# Patient Record
Sex: Male | Born: 1955 | Race: White | Hispanic: No | Marital: Married | Smoking: Former smoker
Health system: Southern US, Community
[De-identification: ages and names within clinical notes are randomized; demographics above are authoritative.]

## PROBLEM LIST (undated history)

## (undated) DIAGNOSIS — M1990 Unspecified osteoarthritis, unspecified site: Secondary | ICD-10-CM

## (undated) DIAGNOSIS — I10 Essential (primary) hypertension: Secondary | ICD-10-CM

## (undated) DIAGNOSIS — A048 Other specified bacterial intestinal infections: Secondary | ICD-10-CM

## (undated) DIAGNOSIS — M5020 Other cervical disc displacement, unspecified cervical region: Secondary | ICD-10-CM

## (undated) DIAGNOSIS — C4490 Unspecified malignant neoplasm of skin, unspecified: Secondary | ICD-10-CM

## (undated) DIAGNOSIS — N052 Unspecified nephritic syndrome with diffuse membranous glomerulonephritis: Principal | ICD-10-CM

## (undated) DIAGNOSIS — N059 Unspecified nephritic syndrome with unspecified morphologic changes: Secondary | ICD-10-CM

## (undated) HISTORY — PX: BACK SURGERY: SHX140

## (undated) HISTORY — DX: Unspecified nephritic syndrome with diffuse membranous glomerulonephritis: N05.2

## (undated) HISTORY — PX: OTHER SURGICAL HISTORY: SHX169

## (undated) HISTORY — DX: Unspecified malignant neoplasm of skin, unspecified: C44.90

## (undated) HISTORY — DX: Essential (primary) hypertension: I10

## (undated) HISTORY — DX: Unspecified nephritic syndrome with unspecified morphologic changes: N05.9

## (undated) HISTORY — PX: KNEE SURGERY: SHX244

## (undated) HISTORY — DX: Other cervical disc displacement, unspecified cervical region: M50.20

## (undated) HISTORY — DX: Unspecified osteoarthritis, unspecified site: M19.90

## (undated) HISTORY — DX: Other specified bacterial intestinal infections: A04.8

## (undated) HISTORY — PX: NECK SURGERY: SHX720

---

## 2003-04-01 ENCOUNTER — Encounter: Payer: Self-pay | Admitting: Neurosurgery

## 2003-04-03 ENCOUNTER — Ambulatory Visit (HOSPITAL_COMMUNITY): Admission: RE | Admit: 2003-04-03 | Discharge: 2003-04-03 | Payer: Self-pay | Admitting: Neurosurgery

## 2003-04-03 ENCOUNTER — Encounter: Payer: Self-pay | Admitting: Neurosurgery

## 2003-09-24 ENCOUNTER — Encounter: Payer: Self-pay | Admitting: Neurosurgery

## 2003-09-28 ENCOUNTER — Ambulatory Visit (HOSPITAL_COMMUNITY): Admission: RE | Admit: 2003-09-28 | Discharge: 2003-09-29 | Payer: Self-pay | Admitting: Neurosurgery

## 2003-09-28 ENCOUNTER — Encounter: Payer: Self-pay | Admitting: Neurosurgery

## 2003-12-02 ENCOUNTER — Encounter: Admission: RE | Admit: 2003-12-02 | Discharge: 2003-12-02 | Payer: Self-pay | Admitting: Neurosurgery

## 2004-11-14 ENCOUNTER — Ambulatory Visit: Payer: Self-pay

## 2005-06-02 ENCOUNTER — Other Ambulatory Visit: Payer: Self-pay

## 2005-06-06 ENCOUNTER — Ambulatory Visit: Payer: Self-pay | Admitting: Orthopaedic Surgery

## 2013-02-02 ENCOUNTER — Emergency Department: Payer: Self-pay | Admitting: Emergency Medicine

## 2013-02-02 ENCOUNTER — Ambulatory Visit: Payer: Self-pay | Admitting: Family Medicine

## 2015-01-19 ENCOUNTER — Observation Stay: Payer: Self-pay | Admitting: Nephrology

## 2015-01-19 DIAGNOSIS — N052 Unspecified nephritic syndrome with diffuse membranous glomerulonephritis: Secondary | ICD-10-CM

## 2015-01-19 HISTORY — DX: Unspecified nephritic syndrome with diffuse membranous glomerulonephritis: N05.2

## 2015-01-19 LAB — COMPREHENSIVE METABOLIC PANEL
ALK PHOS: 52 U/L (ref 46–116)
ALT: 20 U/L (ref 14–63)
Albumin: 2.4 g/dL — ABNORMAL LOW (ref 3.4–5.0)
Anion Gap: 7 (ref 7–16)
BILIRUBIN TOTAL: 0.4 mg/dL (ref 0.2–1.0)
BUN: 22 mg/dL — AB (ref 7–18)
CO2: 30 mmol/L (ref 21–32)
Calcium, Total: 8.7 mg/dL (ref 8.5–10.1)
Chloride: 105 mmol/L (ref 98–107)
Creatinine: 0.87 mg/dL (ref 0.60–1.30)
EGFR (African American): >60
Glucose: 106 mg/dL — ABNORMAL HIGH (ref 65–99)
Osmolality: 287 (ref 275–301)
Potassium: 3.8 mmol/L (ref 3.5–5.1)
SGOT(AST): 18 U/L (ref 15–37)
Sodium: 142 mmol/L (ref 136–145)
Total Protein: 5.6 g/dL — ABNORMAL LOW (ref 6.4–8.2)

## 2015-01-19 LAB — CBC WITH DIFFERENTIAL/PLATELET
Basophil #: 0 10*3/uL (ref 0.0–0.1)
Basophil %: 0.5 %
Eosinophil #: 0.1 10*3/uL (ref 0.0–0.7)
Eosinophil %: 0.8 %
HCT: 43.5 % (ref 40.0–52.0)
HGB: 14.8 g/dL (ref 13.0–18.0)
Lymphocyte #: 1.6 10*3/uL (ref 1.0–3.6)
Lymphocyte %: 18.7 %
MCH: 31 pg (ref 26.0–34.0)
MCHC: 34 g/dL (ref 32.0–36.0)
MCV: 91 fL (ref 80–100)
Monocyte #: 0.5 x10 3/mm (ref 0.2–1.0)
Monocyte %: 5.5 %
Neutrophil #: 6.3 10*3/uL (ref 1.4–6.5)
Neutrophil %: 74.5 %
Platelet: 168 10*3/uL (ref 150–440)
RBC: 4.77 10*6/uL (ref 4.40–5.90)
RDW: 13.4 % (ref 11.5–14.5)
WBC: 8.4 10*3/uL (ref 3.8–10.6)

## 2015-01-19 LAB — PROTEIN / CREATININE RATIO, URINE
Creatinine, Urine: 37.6 mg/dL (ref 30.0–125.0)
Protein, Random Urine: 292 mg/dL — ABNORMAL HIGH (ref 0–12)
Protein/Creat. Ratio: 7766 mg/gCREAT — ABNORMAL HIGH (ref 0–200)

## 2015-01-19 LAB — URINALYSIS, COMPLETE
Bacteria: NONE SEEN
Bilirubin,UR: NEGATIVE
Glucose,UR: NEGATIVE mg/dL (ref 0–75)
Hyaline Cast: 1
Ketone: NEGATIVE
Leukocyte Esterase: NEGATIVE
Nitrite: NEGATIVE
Ph: 6 (ref 4.5–8.0)
Protein: >=500
RBC,UR: 5 /HPF (ref 0–5)
Specific Gravity: 1.008 (ref 1.003–1.030)
Squamous Epithelial: NONE SEEN
WBC UR: 1 /HPF (ref 0–5)

## 2015-01-19 LAB — HEMOGLOBIN: HGB: 15.2 g/dL (ref 13.0–18.0)

## 2015-01-19 LAB — PROTIME-INR
INR: 0.9
Prothrombin Time: 12.1 secs

## 2015-01-20 LAB — BASIC METABOLIC PANEL
ANION GAP: 5 — AB (ref 7–16)
BUN: 27 mg/dL — AB (ref 7–18)
CALCIUM: 8.6 mg/dL (ref 8.5–10.1)
Chloride: 104 mmol/L (ref 98–107)
Co2: 32 mmol/L (ref 21–32)
Creatinine: 1.07 mg/dL (ref 0.60–1.30)
Glucose: 115 mg/dL — ABNORMAL HIGH (ref 65–99)
Osmolality: 287 (ref 275–301)
POTASSIUM: 3.9 mmol/L (ref 3.5–5.1)
Sodium: 141 mmol/L (ref 136–145)

## 2015-01-20 LAB — URINALYSIS, COMPLETE
BILIRUBIN, UR: NEGATIVE
Glucose,UR: NEGATIVE mg/dL (ref 0–75)
KETONE: NEGATIVE
Leukocyte Esterase: NEGATIVE
NITRITE: NEGATIVE
Ph: 6 (ref 4.5–8.0)
Protein: >=500
RBC,UR: 11 /HPF (ref 0–5)
Specific Gravity: 1.018 (ref 1.003–1.030)
Squamous Epithelial: NONE SEEN

## 2015-01-20 LAB — CBC WITH DIFFERENTIAL/PLATELET
Basophil #: 0.1 10*3/uL (ref 0.0–0.1)
Basophil %: 0.6 %
EOS ABS: 0.1 10*3/uL (ref 0.0–0.7)
Eosinophil %: 1.6 %
HCT: 43.4 % (ref 40.0–52.0)
HGB: 14.5 g/dL (ref 13.0–18.0)
LYMPHS PCT: 23 %
Lymphocyte #: 2 10*3/uL (ref 1.0–3.6)
MCH: 31 pg (ref 26.0–34.0)
MCHC: 33.5 g/dL (ref 32.0–36.0)
MCV: 92 fL (ref 80–100)
Monocyte #: 0.5 x10 3/mm (ref 0.2–1.0)
Monocyte %: 6 %
NEUTROS PCT: 68.8 %
Neutrophil #: 6.1 10*3/uL (ref 1.4–6.5)
Platelet: 185 10*3/uL (ref 150–440)
RBC: 4.7 10*6/uL (ref 4.40–5.90)
RDW: 13.7 % (ref 11.5–14.5)
WBC: 8.9 10*3/uL (ref 3.8–10.6)

## 2015-02-18 ENCOUNTER — Ambulatory Visit
Admit: 2015-02-18 | Disposition: A | Discharge status: Home / Self Care | Payer: Self-pay | Attending: Hematology and Oncology | Admitting: Hematology and Oncology

## 2015-03-19 ENCOUNTER — Ambulatory Visit
Admit: 2015-03-19 | Disposition: A | Discharge status: Home / Self Care | Payer: Self-pay | Attending: Hematology and Oncology | Admitting: Hematology and Oncology

## 2015-03-19 ENCOUNTER — Ambulatory Visit
Admit: 2015-03-19 | Disposition: A | Discharge status: Home / Self Care | Payer: Self-pay | Attending: Unknown Physician Specialty | Admitting: Unknown Physician Specialty

## 2015-03-19 DIAGNOSIS — A048 Other specified bacterial intestinal infections: Secondary | ICD-10-CM

## 2015-03-19 HISTORY — DX: Other specified bacterial intestinal infections: A04.8

## 2015-03-22 ENCOUNTER — Ambulatory Visit
Admit: 2015-03-22 | Disposition: A | Discharge status: Home / Self Care | Payer: Self-pay | Attending: Hematology and Oncology | Admitting: Hematology and Oncology

## 2015-03-25 ENCOUNTER — Ambulatory Visit
Admit: 2015-03-25 | Disposition: A | Discharge status: Home / Self Care | Payer: Self-pay | Attending: Hematology and Oncology | Admitting: Hematology and Oncology

## 2015-04-01 LAB — CBC CANCER CENTER
Basophil #: 0 x10 3/mm (ref 0.0–0.1)
Basophil %: 0.4 %
Eosinophil #: 0 x10 3/mm (ref 0.0–0.7)
Eosinophil %: 0.3 %
HCT: 32.2 % — ABNORMAL LOW (ref 40.0–52.0)
HGB: 11 g/dL — ABNORMAL LOW (ref 13.0–18.0)
Lymphocyte #: 1.3 x10 3/mm (ref 1.0–3.6)
Lymphocyte %: 12.8 %
MCH: 30.2 pg (ref 26.0–34.0)
MCHC: 34.2 g/dL (ref 32.0–36.0)
MCV: 88 fL (ref 80–100)
Monocyte #: 0.4 x10 3/mm (ref 0.2–1.0)
Monocyte %: 3.8 %
Neutrophil #: 8.6 x10 3/mm — ABNORMAL HIGH (ref 1.4–6.5)
Neutrophil %: 82.7 %
Platelet: 184 x10 3/mm (ref 150–440)
RBC: 3.64 10*6/uL — ABNORMAL LOW (ref 4.40–5.90)
RDW: 14 % (ref 11.5–14.5)
WBC: 10.4 x10 3/mm (ref 3.8–10.6)

## 2015-04-01 LAB — COMPREHENSIVE METABOLIC PANEL
Albumin: 3.8 g/dL
Alkaline Phosphatase: 37 U/L — ABNORMAL LOW
Anion Gap: 8 (ref 7–16)
BUN: 45 mg/dL — ABNORMAL HIGH
Bilirubin,Total: 0.5 mg/dL
Calcium, Total: 8.9 mg/dL
Chloride: 99 mmol/L — ABNORMAL LOW
Co2: 25 mmol/L
Creatinine: 1.12 mg/dL
EGFR (African American): >60
EGFR (Non-African Amer.): >60
Glucose: 265 mg/dL — ABNORMAL HIGH
Potassium: 4.6 mmol/L
SGOT(AST): 20 U/L
SGPT (ALT): 29 U/L
Sodium: 132 mmol/L — ABNORMAL LOW
Total Protein: 6.4 g/dL — ABNORMAL LOW

## 2015-04-01 LAB — MAGNESIUM: Magnesium: 1.8 mg/dL

## 2015-04-05 LAB — COMPREHENSIVE METABOLIC PANEL
Albumin: 3.8 g/dL
Alkaline Phosphatase: 36 U/L — ABNORMAL LOW
Anion Gap: 7 (ref 7–16)
BUN: 28 mg/dL — ABNORMAL HIGH
Bilirubin,Total: 0.9 mg/dL
Calcium, Total: 9.6 mg/dL
Chloride: 95 mmol/L — ABNORMAL LOW
Co2: 32 mmol/L
Creatinine: 1.12 mg/dL
EGFR (African American): >60
EGFR (Non-African Amer.): >60
Glucose: 152 mg/dL — ABNORMAL HIGH
Potassium: 4.4 mmol/L
SGOT(AST): 18 U/L
SGPT (ALT): 28 U/L
Sodium: 134 mmol/L — ABNORMAL LOW
Total Protein: 6.7 g/dL

## 2015-04-05 LAB — CBC CANCER CENTER
Basophil #: 0.1 x10 3/mm (ref 0.0–0.1)
Basophil %: 0.8 %
Eosinophil #: 0.2 x10 3/mm (ref 0.0–0.7)
Eosinophil %: 2.1 %
HCT: 34.8 % — ABNORMAL LOW (ref 40.0–52.0)
HGB: 12 g/dL — ABNORMAL LOW (ref 13.0–18.0)
Lymphocyte #: 2.1 x10 3/mm (ref 1.0–3.6)
Lymphocyte %: 24.2 %
MCH: 30.6 pg (ref 26.0–34.0)
MCHC: 34.5 g/dL (ref 32.0–36.0)
MCV: 88 fL (ref 80–100)
Monocyte #: 0.6 x10 3/mm (ref 0.2–1.0)
Monocyte %: 7.2 %
Neutrophil #: 5.8 x10 3/mm (ref 1.4–6.5)
Neutrophil %: 65.7 %
Platelet: 185 x10 3/mm (ref 150–440)
RBC: 3.93 10*6/uL — ABNORMAL LOW (ref 4.40–5.90)
RDW: 13.9 % (ref 11.5–14.5)
WBC: 8.8 x10 3/mm (ref 3.8–10.6)

## 2015-04-05 LAB — MAGNESIUM: Magnesium: 1.7 mg/dL

## 2015-04-12 LAB — SURGICAL PATHOLOGY

## 2015-04-15 LAB — COMPREHENSIVE METABOLIC PANEL
Albumin: 3.7 g/dL
Alkaline Phosphatase: 43 U/L
Anion Gap: 6 — ABNORMAL LOW (ref 7–16)
BUN: 22 mg/dL — ABNORMAL HIGH
Bilirubin,Total: 0.6 mg/dL
Calcium, Total: 9.1 mg/dL
Chloride: 103 mmol/L
Co2: 29 mmol/L
Creatinine: 0.92 mg/dL
EGFR (African American): >60
EGFR (Non-African Amer.): >60
Glucose: 134 mg/dL — ABNORMAL HIGH
Potassium: 3.7 mmol/L
SGOT(AST): 19 U/L
SGPT (ALT): 23 U/L
Sodium: 138 mmol/L
Total Protein: 6.5 g/dL

## 2015-04-15 LAB — MAGNESIUM: Magnesium: 2 mg/dL

## 2015-04-15 LAB — CBC CANCER CENTER
Basophil #: 0 x10 3/mm (ref 0.0–0.1)
Basophil %: 0.8 %
Eosinophil #: 0.2 x10 3/mm (ref 0.0–0.7)
Eosinophil %: 2.8 %
HCT: 29.2 % — ABNORMAL LOW (ref 40.0–52.0)
HGB: 10.2 g/dL — ABNORMAL LOW (ref 13.0–18.0)
Lymphocyte #: 1.7 x10 3/mm (ref 1.0–3.6)
Lymphocyte %: 29.5 %
MCH: 31.1 pg (ref 26.0–34.0)
MCHC: 35 g/dL (ref 32.0–36.0)
MCV: 89 fL (ref 80–100)
Monocyte #: 0.4 x10 3/mm (ref 0.2–1.0)
Monocyte %: 7.6 %
Neutrophil #: 3.4 x10 3/mm (ref 1.4–6.5)
Neutrophil %: 59.3 %
Platelet: 193 x10 3/mm (ref 150–440)
RBC: 3.29 10*6/uL — ABNORMAL LOW (ref 4.40–5.90)
RDW: 14.7 % — ABNORMAL HIGH (ref 11.5–14.5)
WBC: 5.7 x10 3/mm (ref 3.8–10.6)

## 2015-04-15 LAB — CREATININE, SERUM: Creatine, Serum: 0.92

## 2015-04-22 ENCOUNTER — Other Ambulatory Visit: Payer: Self-pay

## 2015-04-22 DIAGNOSIS — N052 Unspecified nephritic syndrome with diffuse membranous glomerulonephritis: Secondary | ICD-10-CM

## 2015-04-26 ENCOUNTER — Encounter: Payer: Self-pay | Admitting: Hematology and Oncology

## 2015-04-26 ENCOUNTER — Inpatient Hospital Stay: Payer: Self-pay | Attending: Hematology and Oncology

## 2015-04-26 ENCOUNTER — Inpatient Hospital Stay (HOSPITAL_BASED_OUTPATIENT_CLINIC_OR_DEPARTMENT_OTHER): Payer: Self-pay | Admitting: Hematology and Oncology

## 2015-04-26 VITALS — BP 143/79 | HR 74 | Temp 97.6°F | Resp 20 | Wt 265.9 lb

## 2015-04-26 DIAGNOSIS — R635 Abnormal weight gain: Secondary | ICD-10-CM | POA: Insufficient documentation

## 2015-04-26 DIAGNOSIS — R609 Edema, unspecified: Secondary | ICD-10-CM | POA: Insufficient documentation

## 2015-04-26 DIAGNOSIS — Z7982 Long term (current) use of aspirin: Secondary | ICD-10-CM

## 2015-04-26 DIAGNOSIS — A048 Other specified bacterial intestinal infections: Secondary | ICD-10-CM

## 2015-04-26 DIAGNOSIS — N058 Unspecified nephritic syndrome with other morphologic changes: Secondary | ICD-10-CM

## 2015-04-26 DIAGNOSIS — Z85828 Personal history of other malignant neoplasm of skin: Secondary | ICD-10-CM | POA: Insufficient documentation

## 2015-04-26 DIAGNOSIS — N052 Unspecified nephritic syndrome with diffuse membranous glomerulonephritis: Secondary | ICD-10-CM

## 2015-04-26 DIAGNOSIS — M129 Arthropathy, unspecified: Secondary | ICD-10-CM

## 2015-04-26 DIAGNOSIS — F17210 Nicotine dependence, cigarettes, uncomplicated: Secondary | ICD-10-CM

## 2015-04-26 DIAGNOSIS — I10 Essential (primary) hypertension: Secondary | ICD-10-CM

## 2015-04-26 DIAGNOSIS — Z79899 Other long term (current) drug therapy: Secondary | ICD-10-CM

## 2015-04-26 LAB — URINALYSIS COMPLETE WITH MICROSCOPIC (ARMC ONLY)
Bacteria, UA: NONE SEEN
Bilirubin Urine: NEGATIVE
Glucose, UA: NEGATIVE mg/dL
Hgb urine dipstick: NEGATIVE
Ketones, ur: NEGATIVE mg/dL
Leukocytes, UA: NEGATIVE
Nitrite: NEGATIVE
Protein, ur: 100 mg/dL — AB
Specific Gravity, Urine: 1.017 (ref 1.005–1.030)
pH: 6 (ref 5.0–8.0)

## 2015-04-26 LAB — CBC WITH DIFFERENTIAL/PLATELET
Basophils Absolute: 0 10*3/uL (ref 0–0.1)
Basophils Relative: 1 %
Eosinophils Absolute: 0.1 10*3/uL (ref 0–0.7)
Eosinophils Relative: 2 %
HCT: 29 % — ABNORMAL LOW (ref 40.0–52.0)
Hemoglobin: 9.9 g/dL — ABNORMAL LOW (ref 13.0–18.0)
Lymphocytes Relative: 24 %
Lymphs Abs: 1.6 10*3/uL (ref 1.0–3.6)
MCH: 31.1 pg (ref 26.0–34.0)
MCHC: 34.3 g/dL (ref 32.0–36.0)
MCV: 90.7 fL (ref 80.0–100.0)
Monocytes Absolute: 0.5 10*3/uL (ref 0.2–1.0)
Monocytes Relative: 7 %
Neutro Abs: 4.5 10*3/uL (ref 1.4–6.5)
Neutrophils Relative %: 66 %
Platelets: 229 10*3/uL (ref 150–440)
RBC: 3.2 MIL/uL — ABNORMAL LOW (ref 4.40–5.90)
RDW: 15.6 % — ABNORMAL HIGH (ref 11.5–14.5)
WBC: 6.7 10*3/uL (ref 3.8–10.6)

## 2015-04-26 LAB — COMPREHENSIVE METABOLIC PANEL
ALT: 22 U/L (ref 17–63)
AST: 18 U/L (ref 15–41)
Albumin: 3.8 g/dL (ref 3.5–5.0)
Alkaline Phosphatase: 45 U/L (ref 38–126)
Anion gap: 7 (ref 5–15)
BUN: 18 mg/dL (ref 6–20)
CO2: 28 mmol/L (ref 22–32)
Calcium: 8.9 mg/dL (ref 8.9–10.3)
Chloride: 102 mmol/L (ref 101–111)
Creatinine, Ser: 0.94 mg/dL (ref 0.61–1.24)
GFR calc Af Amer: >60 mL/min (ref >60)
GFR calc non Af Amer: >60 mL/min (ref >60)
Glucose, Bld: 110 mg/dL — ABNORMAL HIGH (ref 65–99)
Potassium: 3.5 mmol/L (ref 3.5–5.1)
Sodium: 137 mmol/L (ref 135–145)
Total Bilirubin: 0.6 mg/dL (ref 0.3–1.2)
Total Protein: 6.5 g/dL (ref 6.5–8.1)

## 2015-04-26 LAB — MAGNESIUM: Magnesium: 1.9 mg/dL (ref 1.7–2.4)

## 2015-04-27 ENCOUNTER — Encounter: Payer: Self-pay | Admitting: Hematology and Oncology

## 2015-04-27 NOTE — Progress Notes (Signed)
[facility] day:  04/26/2015  Chief Complaint: Matthew Benitez is an 59 y.o. 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  HPI: The patient was last seen in the medical oncology clinic on 04/15/2015.  His drug rash had cleared.  At that time, he received his first cycle of Cytoxan.  He tolerated his infusion well.  He denied any nausea.  He feels that his edema has gotten worse.  He has gained weight.  Past Medical History  Diagnosis Date  . Ruptured disc, cervical   . Arthritis   . Skin cancer   . Glomerulonephritis   . Hypertension   . Membranous glomerulonephritis 04/22/2015  . Membranous glomerulonephritis 04/22/2015  . Membranous glomerulonephritis 01/19/2015  . H. pylori infection 03/19/2015    Past Surgical History  Procedure Laterality Date  . Back surgery    . Vocal cord biopsy    . Knee surgery Left   . Neck surgery      Family History  Problem Relation Age of Onset  . Pancreatitis Mother   . Breast cancer Mother   . Breast cancer Sister   . Diabetes Sister   . Diabetes Brother     Social History:  reports that he quit smoking about 2 months ago. He has never used smokeless tobacco. He reports that he does not drink alcohol or use illicit drugs.  The patient is accompanied by his wife today.  Allergies:  Allergies  Allergen Reactions  . Tramadol Other (See Comments)    Hallucinations  . Amoxicillin Rash    To this or Biaxin  . Clarithromycin Rash    To this or amoxicillin    Current Outpatient Prescriptions  Medication Sig Dispense Refill  . aspirin 81 MG tablet Take 81 mg by mouth daily.    . diazepam (VALIUM) 5 MG tablet Take 5 mg by mouth 2 (two) times daily as needed for anxiety.    . furosemide (LASIX) 40 MG tablet Take 40 mg by mouth daily.    Marland Kitchen glipiZIDE (GLUCOTROL) 5 MG tablet Take 5 mg by mouth 2 (two) times daily before a meal.    .  lisinopril-hydrochlorothiazide (PRINZIDE,ZESTORETIC) 20-12.5 MG per tablet Take 0.5 tablets by mouth daily.    Marland Kitchen omeprazole (PRILOSEC) 20 MG capsule Take 20 mg by mouth 2 (two) times daily before a meal.    . ondansetron (ZOFRAN) 8 MG tablet Take by mouth.    . oxyCODONE-acetaminophen (PERCOCET/ROXICET) 5-325 MG per tablet Take 1 tablet by mouth every 4 (four) hours as needed for severe pain.    . potassium chloride (K-DUR,KLOR-CON) 10 MEQ tablet Take 10 mEq by mouth daily.    . ranitidine (ZANTAC) 150 MG tablet Take 150 mg by mouth 2 (two) times daily as needed for heartburn.    . sulfamethoxazole-trimethoprim (BACTRIM,SEPTRA) 400-80 MG per tablet Take by mouth.     No current facility-administered medications for this visit.   Review of Systems:  GENERAL:  Feels puffy.  Energy level modest.  Weight gain. PERFORMANCE STATUS (ECOG):  1 HEENT:  No visual changes, runny nose, sore throat, mouth sores or tenderness. Lungs: No shortness of breath or cough.  No hemoptysis. Cardiac:  No chest pain, palpitations, orthopnea, or PND. GI:  No nausea, vomiting, diarrhea, constipation, melena or hematochezia. GU:  No urgency, frequency, dysuria, or hematuria. Musculoskeletal:  No back pain.  No joint pain.  No muscle tenderness. Extremities:  No pain or swelling. Skin:  No rashes or skin changes. Neuro:  No headache, numbness or weakness, balance or coordination issues. Endocrine:  No diabetes, thyroid issues, hot flashes or night sweats. Psych:  No mood changes, depression or anxiety. Pain:  No focal pain. Review of systems:  All other systems reviewed and found to be negative.  Physical Exam: Blood pressure 143/79, pulse 74, temperature 97.6 F (36.4 C), temperature source Tympanic, resp. rate 20, weight 265 lb 14 oz (120.6 kg).  GENERAL:  Well developed, well nourished gentleman sitting comfortably in the exam room in no acute distress. MENTAL STATUS:  Alert and oriented to person, place and  time. HEAD: Dark hair.  Normocephalic, atraumatic, face symmetric.  Cushingoid features. EYES:  Pupils equal round and reactive to light and accomodation.  No conjunctivitis or scleral icterus. ENT:  Oropharynx clear without lesion.  Tongue normal. Mucous membranes moist.  RESPIRATORY:  Clear to auscultation without rales, wheezes or rhonchi. CARDIOVASCULAR:  Regular rate and rhythm without murmur, rub or gallop. ABDOMEN:  Soft, non-tender, with active bowel sounds, and no appreciable hepatosplenomegaly.  No masses. SKIN:  No rashes, ulcers or lesions. EXTREMITIES: Chronic lower extremity edema.  No palpable cords. LYMPH NODES: No palpable cervical, supraclavicular, axillary or inguinal adenopathy  NEUROLOGICAL: Unremarkable. PSYCH:  Appropriate.  Office Visit on 04/26/2015  Component Date Value Ref Range Status  . Color, Urine 04/26/2015 YELLOW* YELLOW Final  . APPearance 04/26/2015 CLEAR* CLEAR Final  . Glucose, UA 04/26/2015 NEGATIVE  NEGATIVE mg/dL Final  . Bilirubin Urine 04/26/2015 NEGATIVE  NEGATIVE Final  . Ketones, ur 04/26/2015 NEGATIVE  NEGATIVE mg/dL Final  . Specific Gravity, Urine 04/26/2015 1.017  1.005 - 1.030 Final  . Hgb urine dipstick 04/26/2015 NEGATIVE  NEGATIVE Final  . pH 04/26/2015 6.0  5.0 - 8.0 Final  . Protein, ur 04/26/2015 100* NEGATIVE mg/dL Final  . Nitrite 04/26/2015 NEGATIVE  NEGATIVE Final  . Leukocytes, UA 04/26/2015 NEGATIVE  NEGATIVE Final  . RBC / HPF 04/26/2015 0-5  0 - 5 RBC/hpf Final  . WBC, UA 04/26/2015 0-5  0 - 5 WBC/hpf Final  . Bacteria, UA 04/26/2015 NONE SEEN  NONE SEEN Final  . Squamous Epithelial / LPF 04/26/2015 0-5* NONE SEEN Final  . Hyaline Casts, UA 04/26/2015 PRESENT   Final  Appointment on 04/26/2015  Component Date Value Ref Range Status  . WBC 04/26/2015 6.7  3.8 - 10.6 K/uL Final  . RBC 04/26/2015 3.20* 4.40 - 5.90 MIL/uL Final  . Hemoglobin 04/26/2015 9.9* 13.0 - 18.0 g/dL Final  . HCT 04/26/2015 29.0* 40.0 - 52.0 %  Final  . MCV 04/26/2015 90.7  80.0 - 100.0 fL Final  . MCH 04/26/2015 31.1  26.0 - 34.0 pg Final  . MCHC 04/26/2015 34.3  32.0 - 36.0 g/dL Final  . RDW 04/26/2015 15.6* 11.5 - 14.5 % Final  . Platelets 04/26/2015 229  150 - 440 K/uL Final  . Neutrophils Relative % 04/26/2015 66   Final  . Neutro Abs 04/26/2015 4.5  1.4 - 6.5 K/uL Final  . Lymphocytes Relative 04/26/2015 24   Final  . Lymphs Abs 04/26/2015 1.6  1.0 - 3.6 K/uL Final  . Monocytes Relative 04/26/2015 7   Final  . Monocytes Absolute 04/26/2015 0.5  0.2 - 1.0 K/uL Final  . Eosinophils Relative 04/26/2015 2   Final  . Eosinophils Absolute 04/26/2015 0.1  0 - 0.7 K/uL Final  . Basophils Relative 04/26/2015 1   Final  . Basophils Absolute  04/26/2015 0.0  0 - 0.1 K/uL Final  . Sodium 04/26/2015 137  135 - 145 mmol/L Final  . Potassium 04/26/2015 3.5  3.5 - 5.1 mmol/L Final  . Chloride 04/26/2015 102  101 - 111 mmol/L Final  . CO2 04/26/2015 28  22 - 32 mmol/L Final  . Glucose, Bld 04/26/2015 110* 65 - 99 mg/dL Final  . BUN 04/26/2015 18  6 - 20 mg/dL Final  . Creatinine, Ser 04/26/2015 0.94  0.61 - 1.24 mg/dL Final  . Calcium 04/26/2015 8.9  8.9 - 10.3 mg/dL Final  . Total Protein 04/26/2015 6.5  6.5 - 8.1 g/dL Final  . Albumin 04/26/2015 3.8  3.5 - 5.0 g/dL Final  . AST 04/26/2015 18  15 - 41 U/L Final  . ALT 04/26/2015 22  17 - 63 U/L Final  . Alkaline Phosphatase 04/26/2015 45  38 - 126 U/L Final  . Total Bilirubin 04/26/2015 0.6  0.3 - 1.2 mg/dL Final  . GFR calc non Af Amer 04/26/2015 >60  >60 mL/min Final  . GFR calc Af Amer 04/26/2015 >60  >60 mL/min Final   Comment: (NOTE) The eGFR has been calculated using the CKD EPI equation. This calculation has not been validated in all clinical situations. eGFR's persistently <60 mL/min signify possible Chronic Kidney Disease.   . Anion gap 04/26/2015 7  5 - 15 Final  . Magnesium 04/26/2015 1.9  1.7 - 2.4 mg/dL Final   Assessment:  The patient is a 59 year old gentleman  with membranous glomerulonephritis diagnosed by renal biopsy on 01/19/2015. PLA2R stain was positive. He presented with nephrotic range proteinuria and edema.  He has a 60-80 pack year smoking history. Malignancy work-up on 02/18/2015 revealed a normal PSA (0.7) and an elevated CEA (6.3).  Chest CT on 03/04/2015 revealed no evidence of malignancy.  EGD on 03/19/2015 revealed a short segment of salmon colored mucosa (biopsied).  H pylori was detected.  Colonoscopy on 03/19/2015 revealed 10 colonic polyps (benign).  PET scan on 03/25/2015 revealed no evidence of metastatic disease.  He completed on a steroid taper for his glomerulonephritis on 04/03/2015.  He is at risk for adrenal insufficiency.  Cortisol level was normal on 04/05/2015.  He is on Septra for PCP prophylaxis.  He was on triple therapy for H pylori from 03/26/2015 until 04/01/2015.  He developed a drug rash likely due to clarithromycin.  He is currently not on any treatment for H pylori.  He is currently day 12 s/p cycle #1 Cytoxan (04/15/2015).  He received 500 mg/m2 (max dose 1 gm IV per plan).  He tolerated his chemotherapy well.  He is concerned about increasing weight and fluid retention.  Counts are good.  Electrolytes and creatinine are stable.  Plan: 1. Labs today:  CBC with diff, BMP, Mg. 2. Urinalylsis:   assess proteinuria. 3. Anticipate next cycle of chemotherapy on 06/10/2015 (every other month schedule). 4. Patient to follow-up with Dr. Juleen China tomorrow per prior plan. 5. Patient to call for follow-up appointment after meets with Dr. Juleen China.  Lequita Asal, MD  04/26/2015, 5:24 PM

## 2015-06-09 ENCOUNTER — Telehealth: Payer: Self-pay | Admitting: *Deleted

## 2015-06-09 NOTE — Telephone Encounter (Addendum)
I called and left a message for [facility], Dr. Assunta Gambles nurse at [phone]; to see when he could receive his next dose of Cytoxan. I asked if she could give me a call back at the [facility] in [place] at [phone]... I called again on 06/14/15 and was informed by Larene Beach that pt is scheduled for his f/u with Dr. Mike Gip on 06/18/15.Marland KitchenMarland Kitchen

## 2015-06-14 ENCOUNTER — Telehealth: Payer: Self-pay | Admitting: *Deleted

## 2015-06-18 ENCOUNTER — Telehealth: Payer: Self-pay | Admitting: *Deleted

## 2015-06-18 ENCOUNTER — Inpatient Hospital Stay: Payer: Self-pay

## 2015-06-18 ENCOUNTER — Other Ambulatory Visit: Payer: Self-pay | Admitting: Hematology and Oncology

## 2015-06-18 ENCOUNTER — Inpatient Hospital Stay: Payer: Self-pay | Attending: Hematology and Oncology | Admitting: Hematology and Oncology

## 2015-06-18 VITALS — BP 144/79 | HR 70 | Temp 97.9°F | Wt 270.9 lb

## 2015-06-18 DIAGNOSIS — N052 Unspecified nephritic syndrome with diffuse membranous glomerulonephritis: Secondary | ICD-10-CM

## 2015-06-18 DIAGNOSIS — M5020 Other cervical disc displacement, unspecified cervical region: Secondary | ICD-10-CM | POA: Insufficient documentation

## 2015-06-18 DIAGNOSIS — M129 Arthropathy, unspecified: Secondary | ICD-10-CM | POA: Insufficient documentation

## 2015-06-18 DIAGNOSIS — Z8619 Personal history of other infectious and parasitic diseases: Secondary | ICD-10-CM | POA: Insufficient documentation

## 2015-06-18 DIAGNOSIS — Z5111 Encounter for antineoplastic chemotherapy: Secondary | ICD-10-CM | POA: Insufficient documentation

## 2015-06-18 DIAGNOSIS — Z85828 Personal history of other malignant neoplasm of skin: Secondary | ICD-10-CM | POA: Insufficient documentation

## 2015-06-18 DIAGNOSIS — I10 Essential (primary) hypertension: Secondary | ICD-10-CM | POA: Insufficient documentation

## 2015-06-18 DIAGNOSIS — Z79899 Other long term (current) drug therapy: Secondary | ICD-10-CM | POA: Insufficient documentation

## 2015-06-18 DIAGNOSIS — Z87891 Personal history of nicotine dependence: Secondary | ICD-10-CM | POA: Insufficient documentation

## 2015-06-18 DIAGNOSIS — Z7982 Long term (current) use of aspirin: Secondary | ICD-10-CM | POA: Insufficient documentation

## 2015-06-18 DIAGNOSIS — R609 Edema, unspecified: Secondary | ICD-10-CM | POA: Insufficient documentation

## 2015-06-18 LAB — COMPREHENSIVE METABOLIC PANEL
ALT: 24 U/L (ref 17–63)
AST: 17 U/L (ref 15–41)
Albumin: 4.1 g/dL (ref 3.5–5.0)
Alkaline Phosphatase: 43 U/L (ref 38–126)
Anion gap: 8 (ref 5–15)
BUN: 20 mg/dL (ref 6–20)
CO2: 31 mmol/L (ref 22–32)
Calcium: 9.5 mg/dL (ref 8.9–10.3)
Chloride: 98 mmol/L — ABNORMAL LOW (ref 101–111)
Creatinine, Ser: 0.87 mg/dL (ref 0.61–1.24)
GFR calc Af Amer: >60 mL/min (ref >60)
GFR calc non Af Amer: >60 mL/min (ref >60)
Glucose, Bld: 128 mg/dL — ABNORMAL HIGH (ref 65–99)
Potassium: 3.6 mmol/L (ref 3.5–5.1)
Sodium: 137 mmol/L (ref 135–145)
Total Bilirubin: 0.5 mg/dL (ref 0.3–1.2)
Total Protein: 6.8 g/dL (ref 6.5–8.1)

## 2015-06-18 LAB — CBC WITH DIFFERENTIAL/PLATELET
Basophils Absolute: 0 10*3/uL (ref 0–0.1)
Basophils Relative: 1 %
Eosinophils Absolute: 0.1 10*3/uL (ref 0–0.7)
Eosinophils Relative: 2 %
HCT: 35.9 % — ABNORMAL LOW (ref 40.0–52.0)
Hemoglobin: 12 g/dL — ABNORMAL LOW (ref 13.0–18.0)
Lymphocytes Relative: 24 %
Lymphs Abs: 1.8 10*3/uL (ref 1.0–3.6)
MCH: 30.7 pg (ref 26.0–34.0)
MCHC: 33.5 g/dL (ref 32.0–36.0)
MCV: 91.6 fL (ref 80.0–100.0)
Monocytes Absolute: 0.6 10*3/uL (ref 0.2–1.0)
Monocytes Relative: 8 %
Neutro Abs: 4.9 10*3/uL (ref 1.4–6.5)
Neutrophils Relative %: 65 %
Platelets: 170 10*3/uL (ref 150–440)
RBC: 3.91 MIL/uL — ABNORMAL LOW (ref 4.40–5.90)
RDW: 13.3 % (ref 11.5–14.5)
WBC: 7.4 10*3/uL (ref 3.8–10.6)

## 2015-06-18 LAB — MAGNESIUM: Magnesium: 1.9 mg/dL (ref 1.7–2.4)

## 2015-06-18 MED ORDER — SODIUM CHLORIDE 0.9 % IV SOLN
Freq: Once | INTRAVENOUS | Status: AC
  Administered 2015-06-18: 13:00:00 via INTRAVENOUS
  Filled 2015-06-18: qty 1000

## 2015-06-18 MED ORDER — SODIUM CHLORIDE 0.9 % IV SOLN
1000.0000 mg | Freq: Once | INTRAVENOUS | Status: AC
  Administered 2015-06-18: 1000 mg via INTRAVENOUS
  Filled 2015-06-18: qty 50

## 2015-06-18 MED ORDER — ONDANSETRON HCL 40 MG/20ML IJ SOLN
Freq: Once | INTRAMUSCULAR | Status: AC
  Administered 2015-06-18: 13:00:00 via INTRAVENOUS
  Filled 2015-06-18: qty 4

## 2015-06-18 NOTE — Progress Notes (Signed)
[facility] day:  06/18/2015  Chief Complaint: Matthew Benitez is an 59 y.o. 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  HPI: The patient was last seen in the medical oncology clinic on 04/26/2015.  At that time, he was seen for nadir assessment following cycle #1.  Clinically, he was doing well.  Counts were good.    During the interim, he has notes "they talk like I'm getting better".  He states that his kidney function is 97%.  Urine protein is 1 g as compared to 16 g at diagnosis.  Steroids were stopped last Wednesday. He saw Dr.Kolluru on 06/09/2015. He quit smoking 4 months ago. His only complaint is fluid in his lower extremities. He is on Lasix twice a day. He states that the fluid picks up after steroids are taken off.  Past Medical History  Diagnosis Date  . Ruptured disc, cervical   . Arthritis   . Skin cancer   . Glomerulonephritis   . Hypertension   . Membranous glomerulonephritis 04/22/2015  . Membranous glomerulonephritis 04/22/2015  . Membranous glomerulonephritis 01/19/2015  . H. pylori infection 03/19/2015    Past Surgical History  Procedure Laterality Date  . Back surgery    . Vocal cord biopsy    . Knee surgery Left   . Neck surgery      Family History  Problem Relation Age of Onset  . Pancreatitis Mother   . Breast cancer Mother   . Breast cancer Sister   . Diabetes Sister   . Diabetes Brother     Social History:  reports that he quit smoking about 4 months ago. He has never used smokeless tobacco. He reports that he does not drink alcohol or use illicit drugs.  The patient is accompanied by his wife today.  Allergies:  Allergies  Allergen Reactions  . Tramadol Other (See Comments)    Hallucinations  . Amoxicillin Rash    To this or Biaxin  . Clarithromycin Rash    To this or amoxicillin    Current Outpatient Prescriptions  Medication Sig Dispense  Refill  . aspirin 81 MG tablet Take 81 mg by mouth daily.    . diazepam (VALIUM) 5 MG tablet Take 5 mg by mouth 2 (two) times daily as needed for anxiety.    . furosemide (LASIX) 40 MG tablet Take 40 mg by mouth daily.    Marland Kitchen glipiZIDE (GLUCOTROL) 5 MG tablet Take 5 mg by mouth 2 (two) times daily before a meal.    . HYDROcodone-acetaminophen (NORCO) 7.5-325 MG per tablet Take by mouth.    Marland Kitchen KLOR-CON 10 10 MEQ tablet Take 10 mEq by mouth 2 (two) times daily.  3  . lisinopril-hydrochlorothiazide (PRINZIDE,ZESTORETIC) 20-12.5 MG per tablet TAKE 1 TABLET BY MOUTH TWICE A DAY    . omeprazole (PRILOSEC) 20 MG capsule Take 20 mg by mouth 2 (two) times daily before a meal.    . ondansetron (ZOFRAN) 8 MG tablet Take by mouth.    . potassium chloride (K-DUR,KLOR-CON) 10 MEQ tablet Take 10 mEq by mouth daily.    Marland Kitchen lisinopril-hydrochlorothiazide (PRINZIDE,ZESTORETIC) 20-12.5 MG per tablet Take 0.5 tablets by mouth daily.    Marland Kitchen oxyCODONE-acetaminophen (PERCOCET/ROXICET) 5-325 MG per tablet Take 1 tablet by mouth every 4 (four) hours as needed for severe pain.    . ranitidine (ZANTAC) 150 MG tablet Take 150 mg by mouth 2 (two) times daily as  needed for heartburn.    . sulfamethoxazole-trimethoprim (BACTRIM,SEPTRA) 400-80 MG per tablet Take by mouth.     No current facility-administered medications for this visit.   Review of Systems:  GENERAL:  Feels good.  Energy level modest.  Weight gain. PERFORMANCE STATUS (ECOG):  1 HEENT:  No visual changes, runny nose, sore throat, mouth sores or tenderness. Lungs: No shortness of breath or cough.  No hemoptysis. Cardiac:  No chest pain, palpitations, orthopnea, or PND. GI:  No nausea, vomiting, diarrhea, constipation, melena or hematochezia. GU:  No urgency, frequency, dysuria, or hematuria. Musculoskeletal:  No back pain.  No joint pain.  No muscle tenderness. Extremities:  Lower extremity swelling. Skin:  No rashes or skin changes. Neuro:  No headache,  numbness or weakness, balance or coordination issues. Endocrine:  No diabetes, thyroid issues, hot flashes or night sweats. Psych:  No mood changes, depression or anxiety. Pain:  No focal pain. Review of systems:  All other systems reviewed and found to be negative.  Physical Exam: Blood pressure 144/79, pulse 70, temperature 97.9 F (36.6 C), temperature source Oral, weight 270 lb 15.1 oz (122.9 kg), SpO2 97 %.  GENERAL:  Well developed, well nourished gentleman sitting comfortably in the exam room in no acute distress. MENTAL STATUS:  Alert and oriented to person, place and time. HEAD: Dark hair with gray sideburns.  Mustache.  Normocephalic, atraumatic, face symmetric.  Cushingoid features. NECK:  Thick without adenopathy. EYES:  Glasses.  Brown eyes.  Pupils equal round and reactive to light and accomodation.  No conjunctivitis or scleral icterus. ENT:  Oropharynx clear without lesion.  Tongue normal. Mucous membranes moist.  RESPIRATORY:  Clear to auscultation without rales, wheezes or rhonchi. CARDIOVASCULAR:  Regular rate and rhythm without murmur, rub or gallop. ABDOMEN:  Fully round.  Soft, non-tender, with active bowel sounds, and no appreciable hepatosplenomegaly.  No masses. SKIN:  No rashes, ulcers or lesions. EXTREMITIES: Chronic lower extremity edema.  No palpable cords. LYMPH NODES: No palpable cervical, supraclavicular, axillary or inguinal adenopathy  NEUROLOGICAL: Unremarkable. PSYCH:  Appropriate.  No visits with results within 3 Day(s) from this visit. Latest known visit with results is:  Office Visit on 04/26/2015  Component Date Value Ref Range Status  . Color, Urine 04/26/2015 YELLOW* YELLOW Final  . APPearance 04/26/2015 CLEAR* CLEAR Final  . Glucose, UA 04/26/2015 NEGATIVE  NEGATIVE mg/dL Final  . Bilirubin Urine 04/26/2015 NEGATIVE  NEGATIVE Final  . Ketones, ur 04/26/2015 NEGATIVE  NEGATIVE mg/dL Final  . Specific Gravity, Urine 04/26/2015 1.017  1.005 -  1.030 Final  . Hgb urine dipstick 04/26/2015 NEGATIVE  NEGATIVE Final  . pH 04/26/2015 6.0  5.0 - 8.0 Final  . Protein, ur 04/26/2015 100* NEGATIVE mg/dL Final  . Nitrite 04/26/2015 NEGATIVE  NEGATIVE Final  . Leukocytes, UA 04/26/2015 NEGATIVE  NEGATIVE Final  . RBC / HPF 04/26/2015 0-5  0 - 5 RBC/hpf Final  . WBC, UA 04/26/2015 0-5  0 - 5 WBC/hpf Final  . Bacteria, UA 04/26/2015 NONE SEEN  NONE SEEN Final  . Squamous Epithelial / LPF 04/26/2015 0-5* NONE SEEN Final  . 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 PLA2R stain was positive. He presented with nephrotic range proteinuria and edema.  He has a 60-80 pack year smoking history. Malignancy work-up on 02/18/2015 revealed a normal PSA (0.7) and an elevated CEA (6.3).  Chest  CT on 03/04/2015 revealed no evidence of malignancy.  EGD on 03/19/2015 revealed a short segment of salmon colored mucosa (biopsied).  H pylori was detected.  Colonoscopy on 03/19/2015 revealed 10 colonic polyps (benign).  PET scan on 03/25/2015 revealed no evidence of metastatic disease.  He completed on a steroid taper for his glomerulonephritis on 04/03/2015.  He is at risk for adrenal insufficiency.  Cortisol level was normal on 04/05/2015.  He is on Septra for PCP prophylaxis.  He was on triple therapy for H pylori from 03/26/2015 until 04/01/2015.  He developed a drug rash likely due to clarithromycin.  He is status post 1 cycle of Cytoxan (04/15/2015).  He received 500 mg/m2 (max dose 1 gm IV per plan).  Per his report, proteinuria has decreased from 16 gm to 1 gm.  Plan: 1. Labs today:  CBC with diff, CMP, Mg. 2. Cycle #2 Cytoxan today. 3. Refill Septra DS (escribe to [facility] in [place]). 4. RTC in 8 weeks (08/13/2015) for MD assess, labs, and cycle #3 Cytoxan.  Lequita Asal, MD  06/18/2015

## 2015-06-18 NOTE — Telephone Encounter (Signed)
V.O. Dr. Mike Gip- Please call in a refill on pt's Septra... Pt's refill called in to [facility] in [place], [place].Marland KitchenMarland Kitchen

## 2015-08-13 ENCOUNTER — Inpatient Hospital Stay (HOSPITAL_BASED_OUTPATIENT_CLINIC_OR_DEPARTMENT_OTHER): Payer: Self-pay | Admitting: Hematology and Oncology

## 2015-08-13 ENCOUNTER — Inpatient Hospital Stay: Payer: Self-pay | Attending: Hematology and Oncology

## 2015-08-13 ENCOUNTER — Other Ambulatory Visit: Payer: Self-pay

## 2015-08-13 ENCOUNTER — Inpatient Hospital Stay: Payer: Self-pay

## 2015-08-13 ENCOUNTER — Other Ambulatory Visit: Payer: Self-pay | Admitting: Hematology and Oncology

## 2015-08-13 VITALS — BP 132/71 | HR 78 | Temp 98.2°F | Resp 18 | Wt 273.4 lb

## 2015-08-13 DIAGNOSIS — Z8619 Personal history of other infectious and parasitic diseases: Secondary | ICD-10-CM | POA: Insufficient documentation

## 2015-08-13 DIAGNOSIS — R609 Edema, unspecified: Secondary | ICD-10-CM

## 2015-08-13 DIAGNOSIS — Z7982 Long term (current) use of aspirin: Secondary | ICD-10-CM

## 2015-08-13 DIAGNOSIS — Z85828 Personal history of other malignant neoplasm of skin: Secondary | ICD-10-CM | POA: Insufficient documentation

## 2015-08-13 DIAGNOSIS — R970 Elevated carcinoembryonic antigen [CEA]: Secondary | ICD-10-CM | POA: Insufficient documentation

## 2015-08-13 DIAGNOSIS — N032 Chronic nephritic syndrome with diffuse membranous glomerulonephritis: Secondary | ICD-10-CM | POA: Insufficient documentation

## 2015-08-13 DIAGNOSIS — M5020 Other cervical disc displacement, unspecified cervical region: Secondary | ICD-10-CM

## 2015-08-13 DIAGNOSIS — Z79899 Other long term (current) drug therapy: Secondary | ICD-10-CM | POA: Insufficient documentation

## 2015-08-13 DIAGNOSIS — N052 Unspecified nephritic syndrome with diffuse membranous glomerulonephritis: Secondary | ICD-10-CM

## 2015-08-13 DIAGNOSIS — Z5111 Encounter for antineoplastic chemotherapy: Secondary | ICD-10-CM | POA: Insufficient documentation

## 2015-08-13 DIAGNOSIS — I10 Essential (primary) hypertension: Secondary | ICD-10-CM | POA: Insufficient documentation

## 2015-08-13 DIAGNOSIS — Z87891 Personal history of nicotine dependence: Secondary | ICD-10-CM | POA: Insufficient documentation

## 2015-08-13 LAB — COMPREHENSIVE METABOLIC PANEL
ALT: 31 U/L (ref 17–63)
AST: 21 U/L (ref 15–41)
Albumin: 4.4 g/dL (ref 3.5–5.0)
Alkaline Phosphatase: 38 U/L (ref 38–126)
Anion gap: 5 (ref 5–15)
BUN: 19 mg/dL (ref 6–20)
CO2: 32 mmol/L (ref 22–32)
Calcium: 9.2 mg/dL (ref 8.9–10.3)
Chloride: 97 mmol/L — ABNORMAL LOW (ref 101–111)
Creatinine, Ser: 0.99 mg/dL (ref 0.61–1.24)
GFR calc Af Amer: >60 mL/min (ref >60)
GFR calc non Af Amer: >60 mL/min (ref >60)
Glucose, Bld: 153 mg/dL — ABNORMAL HIGH (ref 65–99)
Potassium: 4 mmol/L (ref 3.5–5.1)
Sodium: 134 mmol/L — ABNORMAL LOW (ref 135–145)
Total Bilirubin: 0.8 mg/dL (ref 0.3–1.2)
Total Protein: 7.1 g/dL (ref 6.5–8.1)

## 2015-08-13 LAB — CBC WITH DIFFERENTIAL/PLATELET
Basophils Absolute: 0 10*3/uL (ref 0–0.1)
Basophils Relative: 1 %
Eosinophils Absolute: 0.1 10*3/uL (ref 0–0.7)
Eosinophils Relative: 2 %
HCT: 34.6 % — ABNORMAL LOW (ref 40.0–52.0)
Hemoglobin: 11.8 g/dL — ABNORMAL LOW (ref 13.0–18.0)
Lymphocytes Relative: 25 %
Lymphs Abs: 1.7 10*3/uL (ref 1.0–3.6)
MCH: 30.2 pg (ref 26.0–34.0)
MCHC: 34.1 g/dL (ref 32.0–36.0)
MCV: 88.5 fL (ref 80.0–100.0)
Monocytes Absolute: 0.6 10*3/uL (ref 0.2–1.0)
Monocytes Relative: 9 %
Neutro Abs: 4.2 10*3/uL (ref 1.4–6.5)
Neutrophils Relative %: 63 %
Platelets: 161 10*3/uL (ref 150–440)
RBC: 3.91 MIL/uL — ABNORMAL LOW (ref 4.40–5.90)
RDW: 14.2 % (ref 11.5–14.5)
WBC: 6.6 10*3/uL (ref 3.8–10.6)

## 2015-08-13 LAB — MAGNESIUM: Magnesium: 1.9 mg/dL (ref 1.7–2.4)

## 2015-08-13 MED ORDER — SODIUM CHLORIDE 0.9 % IV SOLN
1000.0000 mg | Freq: Once | INTRAVENOUS | Status: AC
  Administered 2015-08-13: 1000 mg via INTRAVENOUS
  Filled 2015-08-13: qty 50

## 2015-08-13 MED ORDER — FUROSEMIDE 40 MG PO TABS: 40.0000 mg | ORAL_TABLET | Freq: Two times a day (BID) | ORAL | Status: AC

## 2015-08-13 MED ORDER — GLIPIZIDE 5 MG PO TABS: 10.0000 mg | ORAL_TABLET | Freq: Two times a day (BID) | ORAL | Status: AC

## 2015-08-13 MED ORDER — SULFAMETHOXAZOLE-TRIMETHOPRIM 400-80 MG PO TABS: 1.0000 | ORAL_TABLET | ORAL | Status: DC

## 2015-08-13 MED ORDER — DIAZEPAM 5 MG PO TABS: 5.0000 mg | ORAL_TABLET | Freq: Two times a day (BID) | ORAL | Status: DC | PRN

## 2015-08-13 MED ORDER — SODIUM CHLORIDE 0.9 % IV SOLN
Freq: Once | INTRAVENOUS | Status: AC
  Administered 2015-08-13: 11:00:00 via INTRAVENOUS
  Filled 2015-08-13: qty 4

## 2015-08-13 MED ORDER — SODIUM CHLORIDE 0.9 % IV SOLN
Freq: Once | INTRAVENOUS | Status: AC
  Administered 2015-08-13: 11:00:00 via INTRAVENOUS
  Filled 2015-08-13: qty 1000

## 2015-08-13 NOTE — Progress Notes (Signed)
[facility] day:  08/13/2015  Chief Complaint: Matthew Benitez is an 59 y.o. 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  HPI: The patient was last seen in the medical oncology clinic on 06/18/2015.  At that time, he was seen for assessment  prior to cycle #2.  He has had proteinuria had decreased from 16 g to 1 g. He was doing well except for some lower extremity edema. He was on Lasix.  The patient was seen by Dr. Juleen China on 08/10/2015. Blood pressure was well controlled on Lasix, lisinopril and hydrochlorothiazide. Blood glucose was not well controlled on glipizide (fasting glucose levels in 120s to to 260s). He continued to avoid non-steroidals.  Results from [facility] revealed total urine protein of 69.4 mg/dL, creatinine 0.86, and albumen 4.4.  During the interim, he has done well. He notes some lower extremity swelling and increased weight.  He denies any fevers or infections. He sees Dr. Juleen China on 09/14/2015.  Past Medical History  Diagnosis Date  . Ruptured disc, cervical   . Arthritis   . Skin cancer   . Glomerulonephritis   . Hypertension   . Membranous glomerulonephritis 04/22/2015  . Membranous glomerulonephritis 04/22/2015  . Membranous glomerulonephritis 01/19/2015  . H. pylori infection 03/19/2015    Past Surgical History  Procedure Laterality Date  . Back surgery    . Vocal cord biopsy    . Knee surgery Left   . Neck surgery      Family History  Problem Relation Age of Onset  . Pancreatitis Mother   . Breast cancer Mother   . Breast cancer Sister   . Diabetes Sister   . Diabetes Brother     Social History:  reports that he quit smoking about 7 months ago. He has never used smokeless tobacco. He reports that he does not drink alcohol or use illicit drugs.  His brother died (age 109) at home during the interim.  The patient is accompanied by his wife  today.  Allergies:  Allergies  Allergen Reactions  . Tramadol Other (See Comments)    Hallucinations  . Amoxicillin Rash    To this or Biaxin  . Clarithromycin Rash    To this or amoxicillin    Current Outpatient Prescriptions  Medication Sig Dispense Refill  . aspirin 81 MG tablet Take 81 mg by mouth daily.    . diazepam (VALIUM) 5 MG tablet Take 1 tablet (5 mg total) by mouth 2 (two) times daily as needed (cramps). 30 tablet   . furosemide (LASIX) 40 MG tablet Take 1 tablet (40 mg total) by mouth 2 (two) times daily. 30 tablet   . glipiZIDE (GLUCOTROL) 5 MG tablet Take 2 tablets (10 mg total) by mouth 2 (two) times daily before a meal.    . HYDROcodone-acetaminophen (NORCO) 7.5-325 MG per tablet Take by mouth.    Marland Kitchen KLOR-CON 10 10 MEQ tablet Take 10 mEq by mouth 2 (two) times daily.  3  . lisinopril-hydrochlorothiazide (PRINZIDE,ZESTORETIC) 20-12.5 MG per tablet TAKE 1 TABLET BY MOUTH TWICE A DAY    . oxyCODONE-acetaminophen (PERCOCET/ROXICET) 5-325 MG per tablet Take 1 tablet by mouth every 4 (four) hours as needed for severe pain.    . ranitidine (ZANTAC) 150 MG tablet Take 150 mg by mouth 2 (two) times daily as needed for heartburn.    . sulfamethoxazole-trimethoprim (BACTRIM,SEPTRA) 400-80 MG per tablet Take 1 tablet by mouth  3 (three) times a week. 36 tablet 0   No current facility-administered medications for this visit.   Review of Systems:  GENERAL:  Feels ok.  Energy level fair.  Weight gain.  No fevers. PERFORMANCE STATUS (ECOG):  1 HEENT:  No visual changes, runny nose, sore throat, mouth sores or tenderness. Lungs: No shortness of breath or cough.  No hemoptysis. Cardiac:  No chest pain, palpitations, orthopnea, or PND. GI:  No nausea, vomiting, diarrhea, constipation, melena or hematochezia. GU:  No urgency, frequency, dysuria, or hematuria. Musculoskeletal:  No back pain.  No joint pain.  No muscle tenderness. Extremities:  Lower extremity swelling. Skin:  No  rashes or skin changes. Neuro:  No headache, numbness or weakness, balance or coordination issues. Endocrine:  No diabetes, thyroid issues, hot flashes or night sweats. Psych:  No mood changes, depression or anxiety. Pain:  No focal pain. Review of systems:  All other systems reviewed and found to be negative.  Physical Exam: Blood pressure 132/71, pulse 78, temperature 98.2 F (36.8 C), temperature source Tympanic, resp. rate 18, weight 273 lb 5.9 oz (124 kg).  GENERAL:  Well developed, well nourished gentleman sitting comfortably in the exam room in no acute distress. MENTAL STATUS:  Alert and oriented to person, place and time. HEAD: Dark hair with gray sideburns.  Normocephalic, atraumatic, face symmetric.  Cushingoid features. EYES:  Pupils equal round and reactive to light and accomodation.  No conjunctivitis or scleral icterus. ENT:  Oropharynx clear without lesion.  No thrush.  Dentures. Tongue normal. Mucous membranes moist.  RESPIRATORY:  Clear to auscultation without rales, wheezes or rhonchi. CARDIOVASCULAR:  Regular rate and rhythm without murmur, rub or gallop. ABDOMEN:  Fully round.  Soft, non-tender, with active bowel sounds, and no appreciable hepatosplenomegaly.  No masses. SKIN:  No rashes, ulcers or lesions. EXTREMITIES: Chronic lower extremity edema (left > right).  No palpable cords. LYMPH NODES: No palpable cervical, supraclavicular, axillary or inguinal adenopathy  NEUROLOGICAL: Unremarkable. PSYCH:  Appropriate.  Appointment on 08/13/2015  Component Date Value Ref Range Status  . WBC 08/13/2015 6.6  3.8 - 10.6 K/uL Final  . RBC 08/13/2015 3.91* 4.40 - 5.90 MIL/uL Final  . Hemoglobin 08/13/2015 11.8* 13.0 - 18.0 g/dL Final  . HCT 08/13/2015 34.6* 40.0 - 52.0 % Final  . MCV 08/13/2015 88.5  80.0 - 100.0 fL Final  . MCH 08/13/2015 30.2  26.0 - 34.0 pg Final  . MCHC 08/13/2015 34.1  32.0 - 36.0 g/dL Final  . RDW 08/13/2015 14.2  11.5 - 14.5 % Final  . Platelets  08/13/2015 161  150 - 440 K/uL Final  . Neutrophils Relative % 08/13/2015 63   Final  . Neutro Abs 08/13/2015 4.2  1.4 - 6.5 K/uL Final  . Lymphocytes Relative 08/13/2015 25   Final  . Lymphs Abs 08/13/2015 1.7  1.0 - 3.6 K/uL Final  . Monocytes Relative 08/13/2015 9   Final  . Monocytes Absolute 08/13/2015 0.6  0.2 - 1.0 K/uL Final  . Eosinophils Relative 08/13/2015 2   Final  . Eosinophils Absolute 08/13/2015 0.1  0 - 0.7 K/uL Final  . Basophils Relative 08/13/2015 1   Final  . Basophils Absolute 08/13/2015 0.0  0 - 0.1 K/uL Final  . Sodium 08/13/2015 134* 135 - 145 mmol/L Final  . Potassium 08/13/2015 4.0  3.5 - 5.1 mmol/L Final  . Chloride 08/13/2015 97* 101 - 111 mmol/L Final  . CO2 08/13/2015 32  22 - 32 mmol/L Final  .  Glucose, Bld 08/13/2015 153* 65 - 99 mg/dL Final  . BUN 08/13/2015 19  6 - 20 mg/dL Final  . Creatinine, Ser 08/13/2015 0.99  0.61 - 1.24 mg/dL Final  . Calcium 08/13/2015 9.2  8.9 - 10.3 mg/dL Final  . Total Protein 08/13/2015 7.1  6.5 - 8.1 g/dL Final  . Albumin 08/13/2015 4.4  3.5 - 5.0 g/dL Final  . AST 08/13/2015 21  15 - 41 U/L Final  . ALT 08/13/2015 31  17 - 63 U/L Final  . Alkaline Phosphatase 08/13/2015 38  38 - 126 U/L Final  . Total Bilirubin 08/13/2015 0.8  0.3 - 1.2 mg/dL Final  . GFR calc non Af Amer 08/13/2015 >60  >60 mL/min Final  . GFR calc Af Amer 08/13/2015 >60  >60 mL/min Final   Comment: (NOTE) The eGFR has been calculated using the CKD EPI equation. This calculation has not been validated in all clinical situations. eGFR's persistently <60 mL/min signify possible Chronic Kidney Disease.   . Anion gap 08/13/2015 5  5 - 15 Final  . Magnesium 08/13/2015 1.9  1.7 - 2.4 mg/dL Final   Assessment:  The patient is a 59 year old gentleman with membranous glomerulonephritis diagnosed by renal biopsy on 01/19/2015. PLA2R stain was positive. He presented with nephrotic range proteinuria and edema.  He has a 60-80 pack year smoking history.  Malignancy work-up on 02/18/2015 revealed a normal PSA (0.7) and an elevated CEA (6.3).  Chest CT on 03/04/2015 revealed no evidence of malignancy.  EGD on 03/19/2015 revealed a short segment of salmon colored mucosa (biopsied).  H pylori was detected.  Colonoscopy on 03/19/2015 revealed 10 colonic polyps (benign).  PET scan on 03/25/2015 revealed no evidence of metastatic disease.  He completed on a steroid taper for his glomerulonephritis on 04/03/2015.  He is at risk for adrenal insufficiency.  Cortisol level was normal on 04/05/2015.  He is on Septra for PCP prophylaxis.  He was on triple therapy for H pylori from 03/26/2015 until 04/01/2015.  He developed a drug rash likely due to clarithromycin.  He is status post 2 cycle of Cytoxan (04/15/2015 and 06/18/2015).  He received 500 mg/m2 (max dose 1 gm IV per plan).  He has tolerated his chemotherapy well.  Per his report, proteinuria has decreased from 16 gm to 1 gm.   Symptomatically, he notes chronic lower extremity swelling.  He has had no issues with infection.  Plan: 1. Labs today:  CBC with diff, CMP, Mg. 2. Cycle #3 Cytoxan today. 3. Refill Septra DS (escribe to [facility] in [place]). 4. RTC prn.   Lequita Asal, MD  08/13/2015, 8:48 AM

## 2015-09-13 ENCOUNTER — Encounter: Payer: Self-pay | Admitting: Hematology and Oncology

## 2015-10-14 ENCOUNTER — Encounter: Payer: Self-pay | Admitting: *Deleted

## 2016-10-26 IMAGING — CT NM PET TUM IMG RESTAG (PS) SKULL BASE T - THIGH
9 series · 25 of 25 positions shown · non-contrast
Comparison: None.

CLINICAL DATA: Initial treatment strategy for elevated CEA..
Evaluate for occult malignancy. History of membranous
glomerulonephritis.

EXAM:
NUCLEAR MEDICINE PET SKULL BASE TO THIGH
TECHNIQUE: 12.4 mCi F-18 FDG was injected intravenously. Full-ring PET imaging
was performed from the skull base to thigh after the radiotracer. CT
data was obtained and used for attenuation correction and anatomic
localization.
FASTING BLOOD GLUCOSE:  Value: 140 mg/dl

[Series 3: ct wb 5.0 b30f · axial · 5.0mm · 0.98mm/px · z∈[-1498,-514]mm · 3 of 329 slices shown]
[im 1/329]
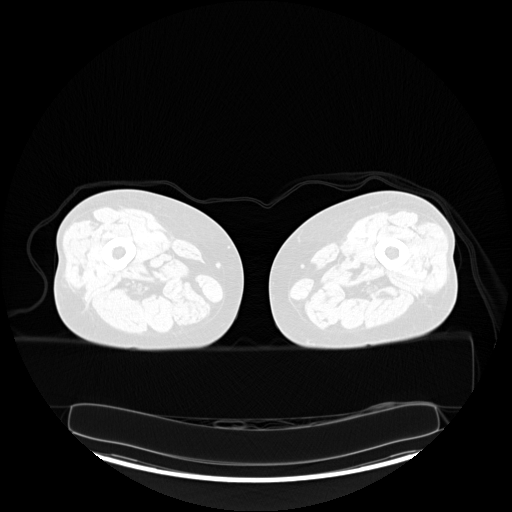
[im 165/329]
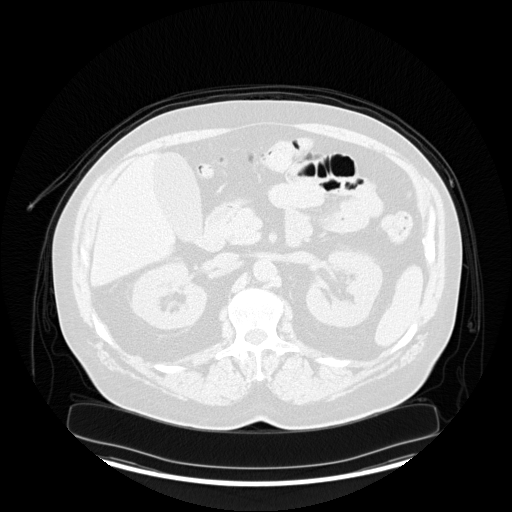
[im 329/329  brain]
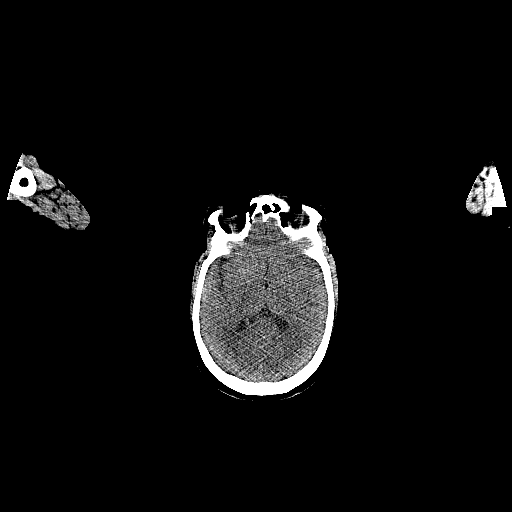

[Series 4: pet wb (ac) · axial · 5.0mm · 4.07mm/px · z∈[-1498,-514]mm · 4 of 329 slices shown]
[im 1/329]
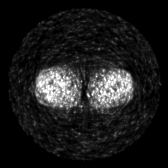
[im 110/329]
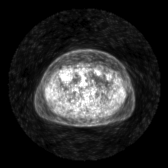
[im 219/329]
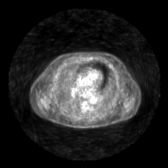
[im 329/329]
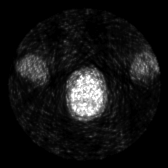

[Series 5: pet wb uncorrected (nac) · axial · 5.0mm · 4.07mm/px · z∈[-1498,-514]mm · 4 of 329 slices shown]
[im 1/329]
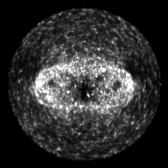
[im 110/329]
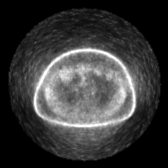
[im 219/329]
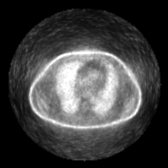
[im 329/329]
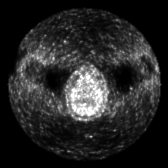

[Series 603: pet axial · 4 of 328 slices shown]
[im 1/328]
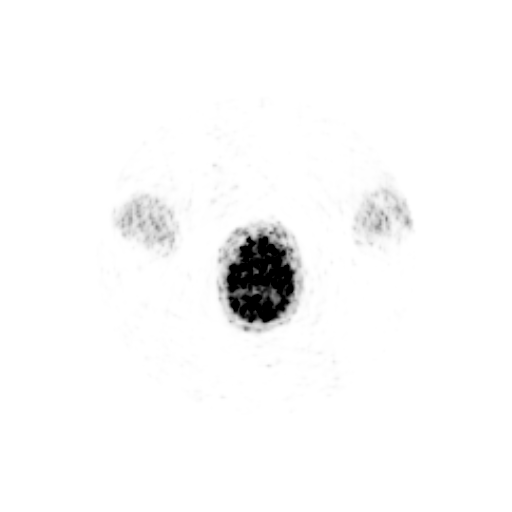
[im 110/328]
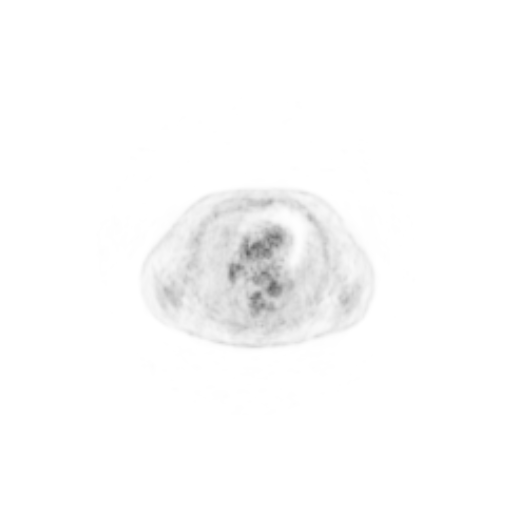
[im 219/328]
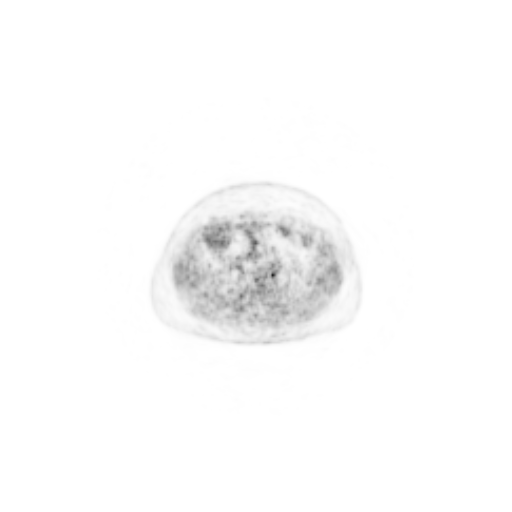
[im 328/328]
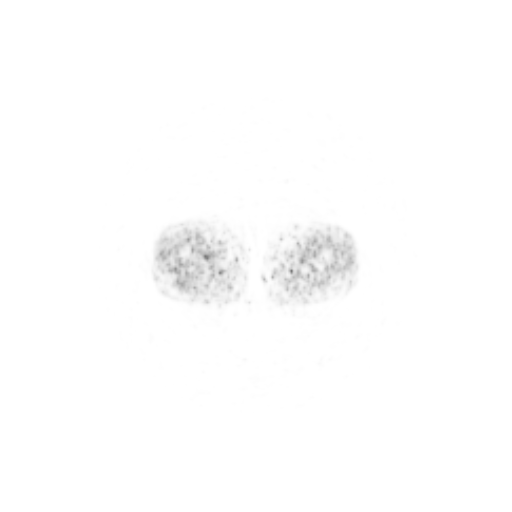

[Series 604: pet coronal · 1 of 114 slices shown]
[im 1/114]
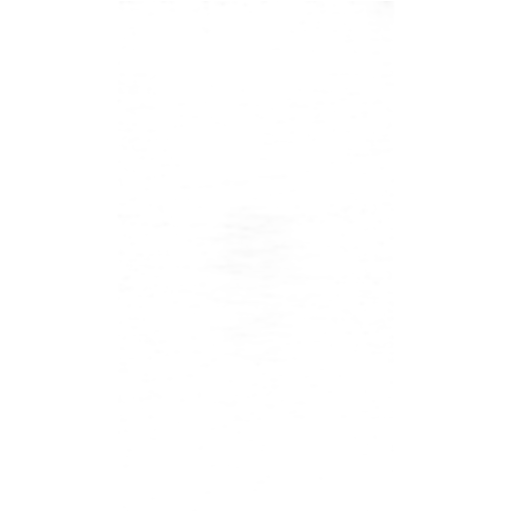

[Series 605: pet sagittal · 2 of 147 slices shown]
[im 1/147]
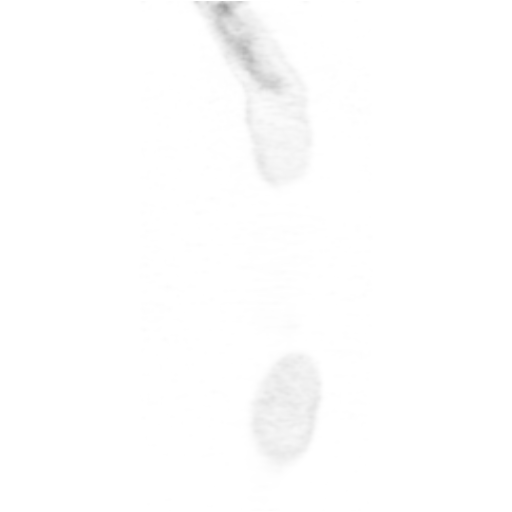
[im 147/147]
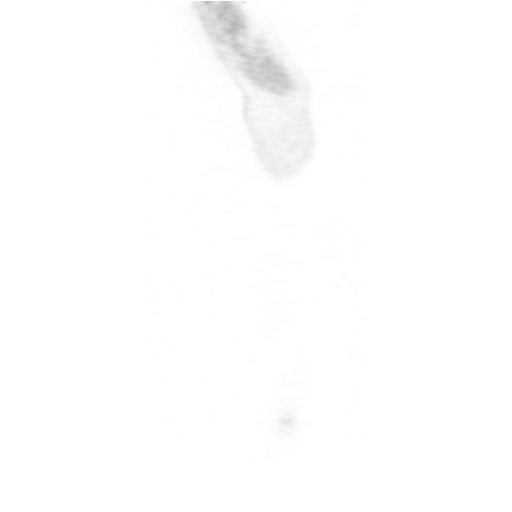

[Series 606: pet/ct axial · 4 of 328 slices shown]
[im 1/328]
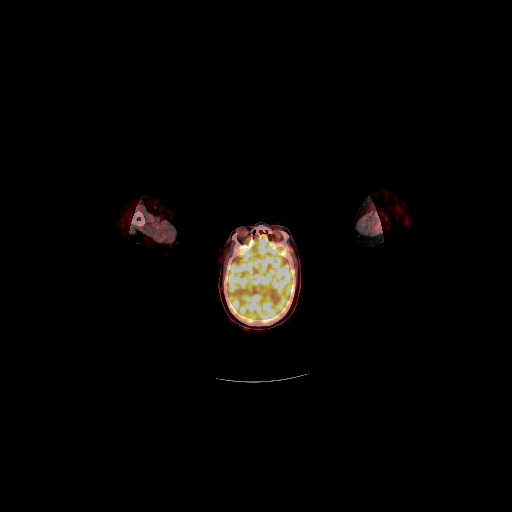
[im 110/328]
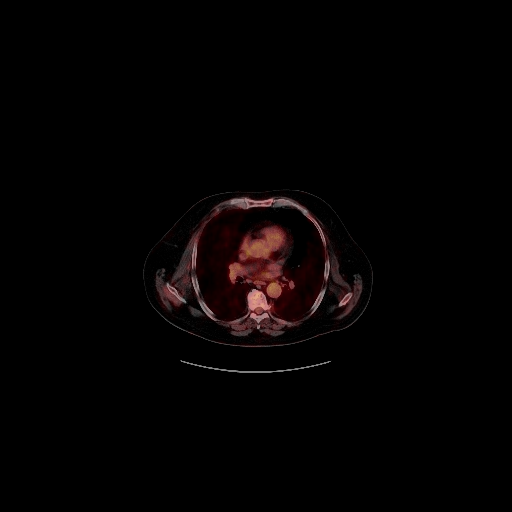
[im 219/328]
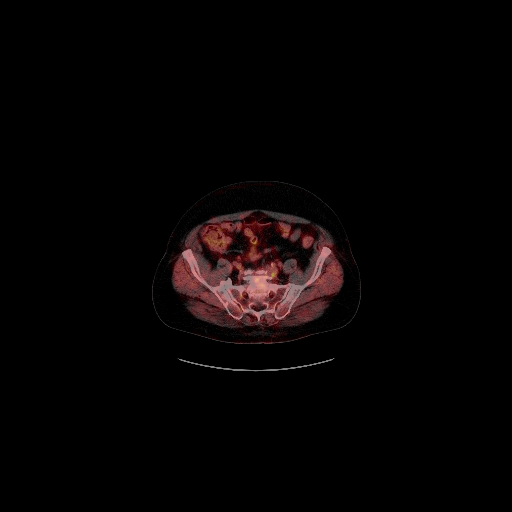
[im 328/328]
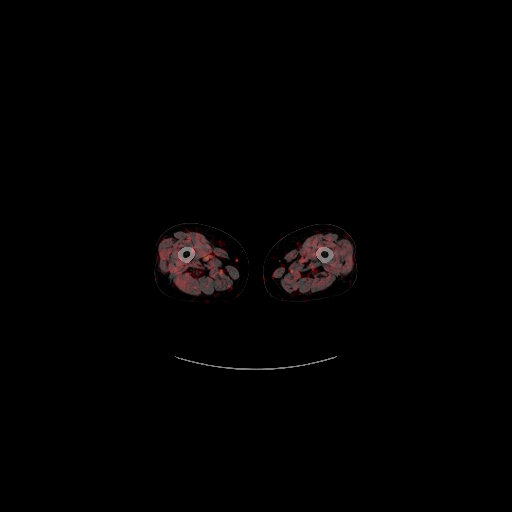

[Series 607: pet/ct coronal · 1 of 80 slices shown]
[im 1/80]
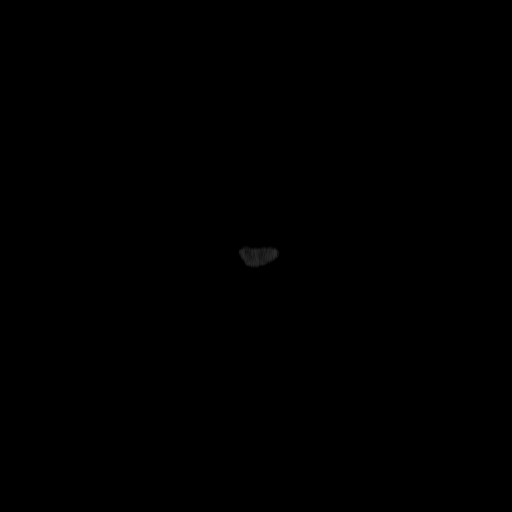

[Series 608: pet/ct sagittal · 2 of 127 slices shown]
[im 1/127]
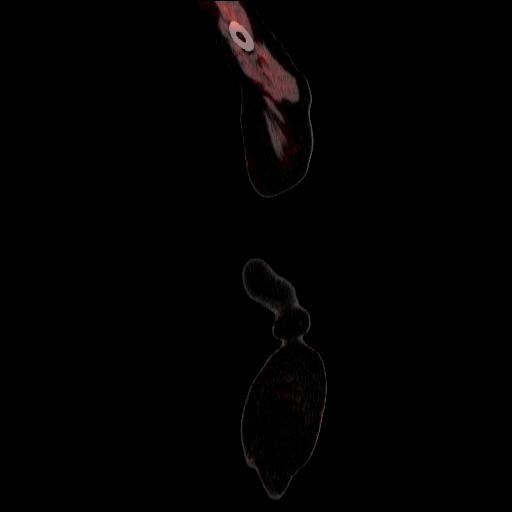
[im 127/127]
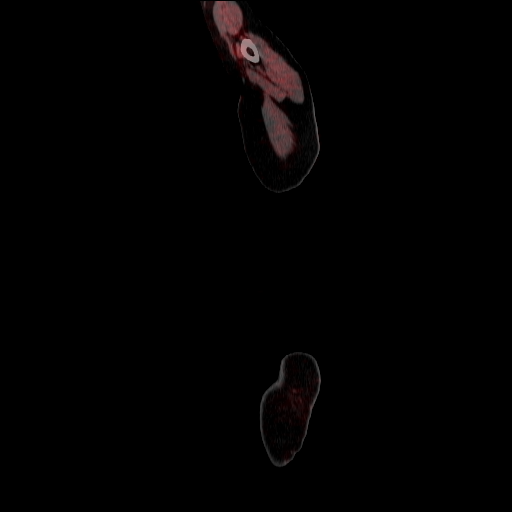

[25 of 25 positions shown; findings below may reference images not displayed]

FINDINGS: NECK

No hypermetabolic lymph nodes in the neck.

CHEST

No hypermetabolic mediastinal or hilar nodes. No suspicious
pulmonary nodules on the CT scan.

ABDOMEN/PELVIS

No abnormal hypermetabolic activity within the liver, pancreas,
adrenal glands, or spleen. No hypermetabolic lymph nodes in the
abdomen or pelvis.

SKELETON

No focal hypermetabolic activity to suggest skeletal metastasis.
IMPRESSION: No evidence of malignancy by FDG PET-CT.

## 2017-01-05 ENCOUNTER — Other Ambulatory Visit: Payer: Self-pay | Admitting: Nurse Practitioner

## 2019-02-14 ENCOUNTER — Encounter: Payer: Self-pay | Admitting: Pharmacy Technician

## 2019-02-14 NOTE — Telephone Encounter (Signed)
Encounter error

## 2019-02-14 NOTE — Progress Notes (Signed)
Encounter error

## 2019-02-14 NOTE — Progress Notes (Signed)
Manufacturer/Drug application is incomplete. They need additional information to process the application. Information needed is signature and income/household size . 2/26 called home number [phone] had no one answer. Left patient message 2/26 on mobile number [phone]. Sent email 2/27 [email or web address].

## 2019-02-21 ENCOUNTER — Encounter: Payer: Self-pay | Admitting: Pharmacy Technician

## 2019-02-21 NOTE — Progress Notes (Signed)
Patient has been approved for drug assistance by [place] for Rituxan. The enrollment period is from 02/21/19-02/20/20 based on self pay. First DOS covered is 02/26/19.

## 2019-03-04 ENCOUNTER — Encounter: Payer: Self-pay | Admitting: Hematology and Oncology

## 2019-03-04 ENCOUNTER — Other Ambulatory Visit: Payer: Self-pay

## 2019-03-04 ENCOUNTER — Inpatient Hospital Stay: Payer: Self-pay

## 2019-03-04 ENCOUNTER — Other Ambulatory Visit: Payer: Self-pay | Admitting: Hematology and Oncology

## 2019-03-04 ENCOUNTER — Inpatient Hospital Stay: Payer: Self-pay | Attending: Hematology and Oncology | Admitting: Hematology and Oncology

## 2019-03-04 VITALS — BP 168/81 | HR 79 | Temp 99.0°F | Resp 18 | Wt 273.1 lb

## 2019-03-04 DIAGNOSIS — N052 Unspecified nephritic syndrome with diffuse membranous glomerulonephritis: Secondary | ICD-10-CM

## 2019-03-04 DIAGNOSIS — Z87891 Personal history of nicotine dependence: Secondary | ICD-10-CM

## 2019-03-04 DIAGNOSIS — Z5112 Encounter for antineoplastic immunotherapy: Secondary | ICD-10-CM | POA: Insufficient documentation

## 2019-03-04 LAB — CBC WITH DIFFERENTIAL/PLATELET
Abs Immature Granulocytes: 0.03 10*3/uL (ref 0.00–0.07)
Basophils Absolute: 0 10*3/uL (ref 0.0–0.1)
Basophils Relative: 1 %
Eosinophils Absolute: 0.1 10*3/uL (ref 0.0–0.5)
Eosinophils Relative: 2 %
HCT: 41.5 % (ref 39.0–52.0)
Hemoglobin: 13.7 g/dL (ref 13.0–17.0)
Immature Granulocytes: 0 %
Lymphocytes Relative: 13 %
Lymphs Abs: 1 10*3/uL (ref 0.7–4.0)
MCH: 30.8 pg (ref 26.0–34.0)
MCHC: 33 g/dL (ref 30.0–36.0)
MCV: 93.3 fL (ref 80.0–100.0)
Monocytes Absolute: 0.4 10*3/uL (ref 0.1–1.0)
Monocytes Relative: 5 %
Neutro Abs: 6.2 10*3/uL (ref 1.7–7.7)
Neutrophils Relative %: 79 %
Platelets: 213 10*3/uL (ref 150–400)
RBC: 4.45 MIL/uL (ref 4.22–5.81)
RDW: 13.8 % (ref 11.5–15.5)
WBC: 7.8 10*3/uL (ref 4.0–10.5)
nRBC: 0 % (ref 0.0–0.2)

## 2019-03-04 LAB — COMPREHENSIVE METABOLIC PANEL
ALT: 24 U/L (ref 0–44)
AST: 19 U/L (ref 15–41)
Albumin: 3.5 g/dL (ref 3.5–5.0)
Alkaline Phosphatase: 40 U/L (ref 38–126)
Anion gap: 6 (ref 5–15)
BUN: 27 mg/dL — ABNORMAL HIGH (ref 8–23)
CO2: 29 mmol/L (ref 22–32)
Calcium: 9.3 mg/dL (ref 8.9–10.3)
Chloride: 102 mmol/L (ref 98–111)
Creatinine, Ser: 0.76 mg/dL (ref 0.61–1.24)
GFR calc Af Amer: >60 mL/min (ref >60)
GFR calc non Af Amer: >60 mL/min (ref >60)
Glucose, Bld: 132 mg/dL — ABNORMAL HIGH (ref 70–99)
Potassium: 4.4 mmol/L (ref 3.5–5.1)
Sodium: 137 mmol/L (ref 135–145)
Total Bilirubin: 0.3 mg/dL (ref 0.3–1.2)
Total Protein: 6.6 g/dL (ref 6.5–8.1)

## 2019-03-04 NOTE — Progress Notes (Signed)
Pt here for new patient visit. Denies any concerns at this time.

## 2019-03-04 NOTE — Progress Notes (Addendum)
[facility] day:  03/04/2019  Chief Complaint: Matthew Benitez is an 63 y.o. male with membranous glomerulonephritis who is seen for assessment prior to initiation of Rituxan.  HPI: The patient was last seen in the medical oncology clinic on 08/13/2015.  At that time, was doing well.  He continued to have lower extremity swelling and increased weight.  He received cycle #3 Cytoxan.  He completed 3 months of prednisone as per the [facility] protocol.  Notes on 10/12/2015 revealed proteinuria had dropped to < 500.  He states that since completion of Cytoxan, he has been on prednisone 5 mg a day.  He was doing well until recently when he developed recurrent proteinuria.  Labs from 01/13/2019 revealed 8855 mg protein in 24 hours.  Options discussed included Rituxan, Cytoxan, or Prograf (tacrolimus).  Patient has been referred by Dr. Juleen China for Rituxan 1500 mg IV x 2 given 2 weeks apart.  Symptomatically, he denies any fevers or weight loss.  He notes "normal sweating for me".  Weight is up secondary to fluid retention.  He notes swelling in face, hands, and legs.  He denies any change in urine.  He states that he "doesn't have a lot of energy".  He has never had hepatitis, although he was tested years ago.   Past Medical History:  Diagnosis Date  . Arthritis   . Glomerulonephritis   . H. pylori infection 03/19/2015  . Hypertension   . Membranous glomerulonephritis 04/22/2015  . Membranous glomerulonephritis 04/22/2015  . Membranous glomerulonephritis 01/19/2015  . Ruptured disc, cervical   . Skin cancer     Past Surgical History:  Procedure Laterality Date  . BACK SURGERY    . KNEE SURGERY Left   . NECK SURGERY    . vocal cord biopsy      Family History  Problem Relation Age of Onset  . Pancreatitis Mother   . Breast cancer Mother   . Breast cancer Sister   . Diabetes Brother     Social History:  reports that he quit smoking about 4  years ago. He has a 63.00 pack-year smoking history. He has never used smokeless tobacco. He reports that he does not drink alcohol or use drugs.  His brother died (age 1) at home.  He previously worked for [employer]  He worked up until Christmas 2019.  He lives in [place]. The patient is accompanied by his wife today.  Allergies:  Allergies  Allergen Reactions  . Tramadol Other (See Comments)    Hallucinations  . Amoxicillin Rash    To this or Biaxin  . Clarithromycin Rash    To this or amoxicillin    Current Outpatient Medications  Medication Sig Dispense Refill  . aspirin 81 MG tablet Take 81 mg by mouth daily.    . DULoxetine (CYMBALTA) 60 MG capsule Take 1 capsule by mouth daily.    . furosemide (LASIX) 40 MG tablet Take 1 tablet (40 mg total) by mouth 2 (two) times daily. (Patient taking differently: Take 40 mg by mouth 3 (three) times daily. ) 30 tablet   . glipiZIDE (GLUCOTROL) 5 MG tablet Take 2 tablets (10 mg total) by mouth 2 (two) times daily before a meal.    . HYDROcodone-acetaminophen (NORCO) 7.5-325 MG per tablet Take 1 tablet by mouth every 6 (six) hours as needed.     Marland Kitchen lisinopril (PRINIVIL,ZESTRIL) 40 MG tablet Take 40 mg by mouth daily.     Marland Kitchen  Magnesium 500 MG CAPS Take 1,000 mg by mouth daily.    . predniSONE (DELTASONE) 5 MG tablet Take 5 mg by mouth daily.    . ranitidine (ZANTAC) 150 MG tablet Take 150 mg by mouth 2 (two) times daily as needed for heartburn.     No current facility-administered medications for this visit.     Review of Systems:  GENERAL:  Feels "ok".  "Never had a lot of energy".  No fevers.  Sweating "normal for me".  Weight "about the same". PERFORMANCE STATUS (ECOG):  1 HEENT:  No visual changes, runny nose, sore throat, mouth sores or tenderness. Lungs: No shortness of breath or cough.  No hemoptysis. Cardiac:  No chest pain, palpitations, orthopnea, or PND. GI:  No nausea, vomiting, diarrhea, constipation, melena or  hematochezia. GU:  No urgency, frequency, dysuria, or hematuria. Musculoskeletal:  No back pain.  No joint pain.  No muscle tenderness. Extremities:  No pain or swelling. Skin:  No rashes or skin changes. Neuro:  No headache, numbness or weakness, balance or coordination issues. Endocrine:  No diabetes, thyroid issues, hot flashes or night sweats. Psych:  No mood changes, depression or anxiety. Pain:  No focal pain. Review of systems:  All other systems reviewed and found to be negative.   Physical Exam: Blood pressure (!) 168/81, pulse 79, temperature 99 F (37.2 C), temperature source Tympanic, resp. rate 18, weight 273 lb 2.4 oz (123.9 kg), SpO2 95 %.  GENERAL:  Well developed, well nourished, gentleman sitting comfortably in the exam room in no acute distress. MENTAL STATUS:  Alert and oriented to person, place and time. HEAD:  Shoulder length dark brown hair.  Normocephalic, atraumatic, face symmetric, no Cushingoid features. EYES:  Glasses.  Brown eyes.  Pupils equal round and reactive to light and accomodation.  No conjunctivitis or scleral icterus. ENT:  Oropharynx clear without lesion.  Tongue normal.  Dentures. Mucous membranes moist.  RESPIRATORY:  Clear to auscultation without rales, wheezes or rhonchi. CARDIOVASCULAR:  Regular rate and rhythm without murmur, rub or gallop. ABDOMEN:  Fully round.  Soft, non-tender, with active bowel sounds, and no appreciable hepatosplenomegaly.  No masses. SKIN:  No rashes, ulcers or lesions. EXTREMITIES:  Chronic lower extremity edema.  No skin discoloration or tenderness.  No palpable cords. LYMPH NODES: No palpable cervical, supraclavicular, axillary or inguinal adenopathy  NEUROLOGICAL: Unremarkable. PSYCH:  Appropriate.    No visits with results within 3 Day(s) from this visit.  Latest known visit with results is:  Appointment on 08/13/2015  Component Date Value Ref Range Status  . WBC 08/13/2015 6.6  3.8 - 10.6 K/uL Final  . RBC  08/13/2015 3.91* 4.40 - 5.90 MIL/uL Final  . Hemoglobin 08/13/2015 11.8* 13.0 - 18.0 g/dL Final  . HCT 08/13/2015 34.6* 40.0 - 52.0 % Final  . MCV 08/13/2015 88.5  80.0 - 100.0 fL Final  . MCH 08/13/2015 30.2  26.0 - 34.0 pg Final  . MCHC 08/13/2015 34.1  32.0 - 36.0 g/dL Final  . RDW 08/13/2015 14.2  11.5 - 14.5 % Final  . Platelets 08/13/2015 161  150 - 440 K/uL Final  . Neutrophils Relative % 08/13/2015 63  % Final  . Neutro Abs 08/13/2015 4.2  1.4 - 6.5 K/uL Final  . Lymphocytes Relative 08/13/2015 25  % Final  . Lymphs Abs 08/13/2015 1.7  1.0 - 3.6 K/uL Final  . Monocytes Relative 08/13/2015 9  % Final  . Monocytes Absolute 08/13/2015 0.6  0.2 - 1.0  K/uL Final  . Eosinophils Relative 08/13/2015 2  % Final  . Eosinophils Absolute 08/13/2015 0.1  0 - 0.7 K/uL Final  . Basophils Relative 08/13/2015 1  % Final  . Basophils Absolute 08/13/2015 0.0  0 - 0.1 K/uL Final  . Sodium 08/13/2015 134* 135 - 145 mmol/L Final  . Potassium 08/13/2015 4.0  3.5 - 5.1 mmol/L Final  . Chloride 08/13/2015 97* 101 - 111 mmol/L Final  . CO2 08/13/2015 32  22 - 32 mmol/L Final  . Glucose, Bld 08/13/2015 153* 65 - 99 mg/dL Final  . BUN 08/13/2015 19  6 - 20 mg/dL Final  . Creatinine, Ser 08/13/2015 0.99  0.61 - 1.24 mg/dL Final  . Calcium 08/13/2015 9.2  8.9 - 10.3 mg/dL Final  . Total Protein 08/13/2015 7.1  6.5 - 8.1 g/dL Final  . Albumin 08/13/2015 4.4  3.5 - 5.0 g/dL Final  . AST 08/13/2015 21  15 - 41 U/L Final  . ALT 08/13/2015 31  17 - 63 U/L Final  . Alkaline Phosphatase 08/13/2015 38  38 - 126 U/L Final  . Total Bilirubin 08/13/2015 0.8  0.3 - 1.2 mg/dL Final  . GFR calc non Af Amer 08/13/2015 >60  >60 mL/min Final  . GFR calc Af Amer 08/13/2015 >60  >60 mL/min Final   Comment: (NOTE) The eGFR has been calculated using the CKD EPI equation. This calculation has not been validated in all clinical situations. eGFR's persistently <60 mL/min signify possible Chronic Kidney Disease.   . Anion  gap 08/13/2015 5  5 - 15 Final  . 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 PLA2R stain was positive. He presented with nephrotic range proteinuria and edema.  He is on prednisone 5 mg a day.  He received 3 cycles of Cytoxan (04/15/2015 - 08/13/2015).  He received 500 mg/m2 (max dose 1 gm IV per plan).  He has tolerated his chemotherapy well.  Per his report, proteinuria has decreased from 16 gm to 1 gm.   Proteinuria dropped to < 500.  He developed recurrent proteinuria.  24 hour urine on 01/13/2019 revealed 8,855 mg protein.   He has a 60-80 pack year smoking history.  Malignancy work-up on 02/18/2015 revealed a normal PSA (0.7) and an elevated CEA (6.3).  Chest CT on 03/04/2015 revealed no evidence of malignancy.  EGD on 03/19/2015 revealed a short segment of salmon colored mucosa (biopsied).  H pylori was detected.  Colonoscopy on 03/19/2015 revealed 10 colonic polyps (benign).  PET scan on 03/25/2015 revealed no evidence of metastatic disease.  He has a history of H pylori in 03/2015.  He developed a drug rash likely due to clarithromycin.  Symptomatically, he has chronic fatigue.  He has recurrent proteinuria with resultant edema (face, hand, and lower extremity).  Exam reveals chronic lower extremity changes and no appreciable hepatosplenomegaly.  Plan: 1. Labs today:  CBC with diff, CMP, hepatitis B surface antigen, hepatitis B core antibody total, hepatitis C antibody. 2.   Membranous glomerulonephritis PLA2R  Patient has recurrent proteinuria.  Discuss Dr. Assunta Gambles plans of Rituxan 1500 mg IV x 2 doses (14 days apart).  Potential side effects of Rituxan reviewed including allergic reaction, reactivation of hepatitis, progressive multifocal leukoencephalopathy (PML), cytopenias, infections.  Premedications:  Solumedrol 100 mg IV, Benadryl 50 mg po, and  Tylenol 650 mg po.  Multiple questions asked and  answered.  Discussed awaiting hepatitis serologies before initiation of therapy.  Patient consented to treatment. 3.  RTC on 03/07/2019 for Rituxan. 4.  RTC on 03/21/2019 for MD assessment and Rituxan.    Lequita Asal, MD  03/04/2019, 9:57 AM

## 2019-03-05 ENCOUNTER — Ambulatory Visit: Payer: Self-pay

## 2019-03-05 ENCOUNTER — Ambulatory Visit: Payer: Self-pay | Admitting: Hematology and Oncology

## 2019-03-05 LAB — HEPATITIS B CORE ANTIBODY, TOTAL: Hep B Core Total Ab: NEGATIVE

## 2019-03-05 LAB — HEPATITIS C ANTIBODY: HCV Ab: <0.1 {s_co_ratio} (ref 0.0–0.9)

## 2019-03-06 ENCOUNTER — Other Ambulatory Visit: Payer: Self-pay

## 2019-03-06 ENCOUNTER — Inpatient Hospital Stay: Payer: Self-pay

## 2019-03-06 LAB — HEPATITIS B SURFACE ANTIGEN: Hepatitis B Surface Ag: NEGATIVE

## 2019-03-07 ENCOUNTER — Other Ambulatory Visit: Payer: Self-pay | Admitting: Hematology and Oncology

## 2019-03-07 ENCOUNTER — Inpatient Hospital Stay: Payer: Self-pay

## 2019-03-07 VITALS — BP 154/79 | HR 72 | Temp 98.6°F | Resp 18

## 2019-03-07 DIAGNOSIS — N052 Unspecified nephritic syndrome with diffuse membranous glomerulonephritis: Secondary | ICD-10-CM

## 2019-03-07 MED ORDER — METHYLPREDNISOLONE SODIUM SUCC 125 MG IJ SOLR
100.0000 mg | Freq: Once | INTRAMUSCULAR | Status: AC
  Administered 2019-03-07: 100 mg via INTRAVENOUS
  Filled 2019-03-07: qty 2

## 2019-03-07 MED ORDER — DIPHENHYDRAMINE HCL 25 MG PO CAPS
50.0000 mg | ORAL_CAPSULE | Freq: Once | ORAL | Status: AC
  Administered 2019-03-07: 50 mg via ORAL
  Filled 2019-03-07: qty 2

## 2019-03-07 MED ORDER — SODIUM CHLORIDE 0.9 % IV SOLN
1500.0000 mg | Freq: Once | INTRAVENOUS | Status: AC
  Administered 2019-03-07: 1500 mg via INTRAVENOUS
  Filled 2019-03-07: qty 150

## 2019-03-07 MED ORDER — SODIUM CHLORIDE 0.9 % IV SOLN
Freq: Once | INTRAVENOUS | Status: AC
  Administered 2019-03-07: 09:00:00 via INTRAVENOUS
  Filled 2019-03-07: qty 250

## 2019-03-07 MED ORDER — ACETAMINOPHEN 325 MG PO TABS
650.0000 mg | ORAL_TABLET | Freq: Once | ORAL | Status: AC
  Administered 2019-03-07: 650 mg via ORAL
  Filled 2019-03-07: qty 2

## 2019-03-07 NOTE — Patient Instructions (Signed)
Rituximab injection  What is this medicine?  RITUXIMAB (ri TUX i mab) is a monoclonal antibody. It is used to treat certain types of cancer like non-Hodgkin lymphoma and chronic lymphocytic leukemia. It is also used to treat rheumatoid arthritis, granulomatosis with polyangiitis (or Wegener's granulomatosis), microscopic polyangiitis, and pemphigus vulgaris.  This medicine may be used for other purposes; ask your health care provider or pharmacist if you have questions.  COMMON BRAND NAME(S): Rituxan  What should I tell my health care provider before I take this medicine?  They need to know if you have any of these conditions:  -heart disease  -infection (especially a virus infection such as hepatitis B, chickenpox, cold sores, or herpes)  -immune system problems  -irregular heartbeat  -kidney disease  -low blood counts, like low white cell, platelet, or red cell counts  -lung or breathing disease, like asthma  -recently received or scheduled to receive a vaccine  -an unusual or allergic reaction to rituximab, other medicines, foods, dyes, or preservatives  -pregnant or trying to get pregnant  -breast-feeding  How should I use this medicine?  This medicine is for infusion into a vein. It is administered in a hospital or clinic by a specially trained health care professional.  A special MedGuide will be given to you by the pharmacist with each prescription and refill. Be sure to read this information carefully each time.  Talk to your pediatrician regarding the use of this medicine in children. This medicine is not approved for use in children.  Overdosage: If you think you have taken too much of this medicine contact a poison control center or emergency room at once.  NOTE: This medicine is only for you. Do not share this medicine with others.  What if I miss a dose?  It is important not to miss a dose. Call your doctor or health care professional if you are unable to keep an appointment.  What may interact with  this medicine?  -cisplatin  -live virus vaccines  This list may not describe all possible interactions. Give your health care provider a list of all the medicines, herbs, non-prescription drugs, or dietary supplements you use. Also tell them if you smoke, drink alcohol, or use illegal drugs. Some items may interact with your medicine.  What should I watch for while using this medicine?  Your condition will be monitored carefully while you are receiving this medicine. You may need blood work done while you are taking this medicine.  This medicine can cause serious allergic reactions. To reduce your risk you may need to take medicine before treatment with this medicine. Take your medicine as directed.  In some patients, this medicine may cause a serious brain infection that may cause death. If you have any problems seeing, thinking, speaking, walking, or standing, tell your healthcare professional right away. If you cannot reach your healthcare professional, urgently seek other source of medical care.  Call your doctor or health care professional for advice if you get a fever, chills or sore throat, or other symptoms of a cold or flu. Do not treat yourself. This drug decreases your body's ability to fight infections. Try to avoid being around people who are sick.  Do not become pregnant while taking this medicine or for 12 months after stopping it. Women should inform their doctor if they wish to become pregnant or think they might be pregnant. There is a potential for serious side effects to an unborn child. Talk to your health   care professional or pharmacist for more information. Do not breast-feed an infant while taking this medicine or for 6 months after stopping it.  What side effects may I notice from receiving this medicine?  Side effects that you should report to your doctor or health care professional as soon as possible:  -allergic reactions like skin rash, itching or hives; swelling of the face, lips, or  tongue  -breathing problems  -chest pain  -changes in vision  -diarrhea  -headache with fever, neck stiffness, sensitivity to light, nausea, or confusion  -fast, irregular heartbeat  -loss of memory  -low blood counts - this medicine may decrease the number of white blood cells, red blood cells and platelets. You may be at increased risk for infections and bleeding.  -mouth sores  -problems with balance, talking, or walking  -redness, blistering, peeling or loosening of the skin, including inside the mouth  -signs of infection - fever or chills, cough, sore throat, pain or difficulty passing urine  -signs and symptoms of kidney injury like trouble passing urine or change in the amount of urine  -signs and symptoms of liver injury like dark yellow or brown urine; general ill feeling or flu-like symptoms; light-colored stools; loss of appetite; nausea; right upper belly pain; unusually weak or tired; yellowing of the eyes or skin  -signs and symptoms of low blood pressure like dizziness; feeling faint or lightheaded, falls; unusually weak or tired  -stomach pain  -swelling of the ankles, feet, hands  -unusual bleeding or bruising  -vomiting  Side effects that usually do not require medical attention (report to your doctor or health care professional if they continue or are bothersome):  -headache  -joint pain  -muscle cramps or muscle pain  -nausea  -tiredness  This list may not describe all possible side effects. Call your doctor for medical advice about side effects. You may report side effects to FDA at 1-800-FDA-1088.  Where should I keep my medicine?  This drug is given in a hospital or clinic and will not be stored at home.  NOTE: This sheet is a summary. It may not cover all possible information. If you have questions about this medicine, talk to your doctor, pharmacist, or health care provider.   2019 Elsevier/Gold Standard (2017-11-16 13:04:32)

## 2019-03-21 ENCOUNTER — Inpatient Hospital Stay: Payer: Managed Care, Other (non HMO) | Attending: Hematology and Oncology | Admitting: Hematology and Oncology

## 2019-03-21 ENCOUNTER — Inpatient Hospital Stay: Payer: Managed Care, Other (non HMO)

## 2019-03-21 ENCOUNTER — Other Ambulatory Visit: Payer: Self-pay

## 2019-03-21 ENCOUNTER — Encounter: Payer: Self-pay | Admitting: Hematology and Oncology

## 2019-03-21 VITALS — BP 164/75 | HR 74 | Temp 98.1°F | Resp 18 | Wt 280.9 lb

## 2019-03-21 VITALS — BP 166/90 | HR 74 | Temp 97.0°F | Resp 18

## 2019-03-21 DIAGNOSIS — Z5112 Encounter for antineoplastic immunotherapy: Secondary | ICD-10-CM | POA: Diagnosis not present

## 2019-03-21 DIAGNOSIS — R609 Edema, unspecified: Secondary | ICD-10-CM

## 2019-03-21 DIAGNOSIS — E119 Type 2 diabetes mellitus without complications: Secondary | ICD-10-CM

## 2019-03-21 DIAGNOSIS — Z85828 Personal history of other malignant neoplasm of skin: Secondary | ICD-10-CM

## 2019-03-21 DIAGNOSIS — I10 Essential (primary) hypertension: Secondary | ICD-10-CM | POA: Diagnosis not present

## 2019-03-21 DIAGNOSIS — N052 Unspecified nephritic syndrome with diffuse membranous glomerulonephritis: Secondary | ICD-10-CM

## 2019-03-21 DIAGNOSIS — R809 Proteinuria, unspecified: Secondary | ICD-10-CM

## 2019-03-21 DIAGNOSIS — Z7984 Long term (current) use of oral hypoglycemic drugs: Secondary | ICD-10-CM | POA: Diagnosis not present

## 2019-03-21 DIAGNOSIS — Z7982 Long term (current) use of aspirin: Secondary | ICD-10-CM

## 2019-03-21 DIAGNOSIS — Z79899 Other long term (current) drug therapy: Secondary | ICD-10-CM | POA: Diagnosis not present

## 2019-03-21 DIAGNOSIS — Z8619 Personal history of other infectious and parasitic diseases: Secondary | ICD-10-CM

## 2019-03-21 DIAGNOSIS — Z87891 Personal history of nicotine dependence: Secondary | ICD-10-CM

## 2019-03-21 MED ORDER — SODIUM CHLORIDE 0.9 % IV SOLN
Freq: Once | INTRAVENOUS | Status: AC
  Administered 2019-03-21: 09:00:00 via INTRAVENOUS
  Filled 2019-03-21: qty 250

## 2019-03-21 MED ORDER — ACETAMINOPHEN 325 MG PO TABS
650.0000 mg | ORAL_TABLET | Freq: Once | ORAL | Status: AC
  Administered 2019-03-21: 650 mg via ORAL

## 2019-03-21 MED ORDER — DIPHENHYDRAMINE HCL 25 MG PO CAPS
50.0000 mg | ORAL_CAPSULE | Freq: Once | ORAL | Status: AC
  Administered 2019-03-21: 09:00:00 50 mg via ORAL

## 2019-03-21 MED ORDER — METHYLPREDNISOLONE SODIUM SUCC 125 MG IJ SOLR
INTRAMUSCULAR | Status: AC
  Filled 2019-03-21: qty 2

## 2019-03-21 MED ORDER — DIPHENHYDRAMINE HCL 25 MG PO CAPS
ORAL_CAPSULE | ORAL | Status: AC
  Filled 2019-03-21: qty 2

## 2019-03-21 MED ORDER — ACETAMINOPHEN 325 MG PO TABS
ORAL_TABLET | ORAL | Status: AC
  Filled 2019-03-21: qty 2

## 2019-03-21 MED ORDER — METHYLPREDNISOLONE SODIUM SUCC 125 MG IJ SOLR
100.0000 mg | Freq: Once | INTRAMUSCULAR | Status: AC
  Administered 2019-03-21: 100 mg via INTRAVENOUS

## 2019-03-21 MED ORDER — SODIUM CHLORIDE 0.9 % IV SOLN
1500.0000 mg | Freq: Once | INTRAVENOUS | Status: AC
  Administered 2019-03-21: 1500 mg via INTRAVENOUS
  Filled 2019-03-21: qty 150

## 2019-03-21 NOTE — Progress Notes (Signed)
No new changes noted today 

## 2019-03-21 NOTE — Progress Notes (Signed)
[facility]     [address]     [place], [place] [address]     Phone: [phone]      Fax: [phone]        Clinic day:  03/21/2019  Chief Complaint: Matthew Benitez is an 63 y.o. 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  HPI: The patient was last seen in the medical oncology clinic on 03/04/2019.  At that time, he noted chronic fatigue.  He had recurrent proteinuria with resultant edema (face, hand, and lower extremity).  Exam revealed chronic lower extremity changes and no appreciable hepatosplenomegaly.  Hepatitis B surface antigen, hepatitis B core antibody total and hepatitis C antibody were negative.  He received Rituxan on 03/07/2019.  Symptomatically, he is doing well.  He states that he can't tell a lot of difference after his first dose of Rituxan.  He does note that the edema in his hands has improved.  Edema in his legs worsens with activity.  He denies any fevers or infections.   Past Medical History:  Diagnosis Date  . Arthritis   . Glomerulonephritis   . H. pylori infection 03/19/2015  . Hypertension   . Membranous glomerulonephritis 04/22/2015  . Membranous glomerulonephritis 04/22/2015  . Membranous glomerulonephritis 01/19/2015  . Ruptured disc, cervical   . Skin cancer     Past Surgical History:  Procedure Laterality Date  . BACK SURGERY    . KNEE SURGERY Left   . NECK SURGERY    . vocal cord biopsy      Family History  Problem Relation Age of Onset  . Pancreatitis Mother   . Breast cancer Mother   . Breast cancer Sister   . Diabetes Brother     Social History:  reports that he quit smoking about 4 years ago. He has a 63.00 pack-year smoking history. He has never used smokeless tobacco. He reports that he does not drink alcohol or use drugs.  His brother died (age 49) at home.  He previously worked for [employer]  He worked up until Christmas 2019.  He  lives in [place]. The patient is alone today.  Allergies:  Allergies  Allergen Reactions  . Tramadol Other (See Comments)    Hallucinations  . Amoxicillin Rash    To this or Biaxin  . Clarithromycin Rash    To this or amoxicillin    Current Outpatient Medications  Medication Sig Dispense Refill  . aspirin 81 MG tablet Take 81 mg by mouth daily.    . DULoxetine (CYMBALTA) 60 MG capsule Take 1 capsule by mouth daily.    . furosemide (LASIX) 40 MG tablet Take 1 tablet (40 mg total) by mouth 2 (two) times daily. (Patient taking differently: Take 40 mg by mouth 3 (three) times daily. ) 30 tablet   . glipiZIDE (GLUCOTROL) 5 MG tablet Take 2 tablets (10 mg total) by mouth 2 (two) times daily before a meal.    . HYDROcodone-acetaminophen (NORCO) 7.5-325 MG per tablet Take 1 tablet by mouth every 6 (six) hours as needed.     Marland Kitchen lisinopril (PRINIVIL,ZESTRIL) 40 MG tablet Take 40 mg by mouth daily.     . Magnesium 500 MG CAPS Take 1,000 mg by mouth daily.    . predniSONE (DELTASONE) 5 MG tablet Take 5 mg by mouth daily.    . ranitidine (ZANTAC) 150 MG tablet Take 150 mg by mouth 2 (two)  times daily as needed for heartburn.     No current facility-administered medications for this visit.     Review of Systems:  GENERAL:  Feels "ok".  No fevers, sweats or weight loss.  Weight gain of 7 pounds. PERFORMANCE STATUS (ECOG):  1 HEENT:  No visual changes, runny nose, sore throat, mouth sores or tenderness. Lungs: No shortness of breath or cough.  No hemoptysis. Cardiac:  No chest pain, palpitations, orthopnea, or PND. GI:  No nausea, vomiting, diarrhea, constipation, melena or hematochezia. GU:  No urgency, frequency, dysuria, or hematuria. Musculoskeletal:  No back pain.  No joint pain.  No muscle tenderness. Extremities:  Swelling in hands has improved.  Swelling in legs worsens with activity. No pain. Skin:  No rashes or skin changes. Neuro:  No headache, numbness or weakness, balance or  coordination issues. Endocrine:  No diabetes, thyroid issues, hot flashes or night sweats. Psych:  No mood changes, depression or anxiety. Pain:  No focal pain. Review of systems:  All other systems reviewed and found to be negative.   Physical Exam: Blood pressure (!) 164/75, pulse 74, temperature 98.1 F (36.7 C), temperature source Tympanic, resp. rate 18, weight 280 lb 13.9 oz (127.4 kg), SpO2 100 %.  GENERAL:  Well developed, well nourished, gentleman sitting comfortably in the exam room in no acute distress. MENTAL STATUS:  Alert and oriented to person, place and time. HEAD:  Shoulder length brown hair.  Graying mustache.  Normocephalic, atraumatic, face symmetric, no Cushingoid features. EYES:  Glasses.  Brown eyes.  Pupils equal round and reactive to light and accomodation.  No conjunctivitis or scleral icterus. ENT:  Oropharynx clear without lesion.  Tongue normal. Mucous membranes moist.  RESPIRATORY:  Clear to auscultation without rales, wheezes or rhonchi. CARDIOVASCULAR:  Regular rate and rhythm without murmur, rub or gallop. ABDOMEN:  Fully round.  Soft, non-tender, with active bowel sounds, and no hepatosplenomegaly.  No masses. SKIN:  No rashes, ulcers or lesions. EXTREMITIES: Chronic lower extremity edema.  Brawny changes.  No tenderness.  No palpable cords. LYMPH NODES: No palpable cervical, supraclavicular, axillary or inguinal adenopathy  NEUROLOGICAL: Unremarkable. PSYCH:  Appropriate.     No visits with results within 3 Day(s) from this visit.  Latest known visit with results is:  Office Visit on 03/04/2019  Component Date Value Ref Range Status  . HCV Ab 03/04/2019 <0.1  0.0 - 0.9 s/co ratio Final   Comment: (NOTE)                                  Negative:     < 0.8                             Indeterminate: 0.8 - 0.9                                  Positive:     > 0.9 The CDC recommends that a positive HCV antibody result be followed up with a HCV Nucleic  Acid Amplification test ([id number]). Performed At: [facility] [place], [place] [id number] Rush Farmer MD NO:[id number]   . Hep B Core Total Ab 03/04/2019 Negative  Negative Final   Comment: (NOTE) Performed At: [facility] [place], [place] [id number] Rush Farmer MD UT:[id number]   .  Sodium 03/04/2019 137  135 - 145 mmol/L Final  . Potassium 03/04/2019 4.4  3.5 - 5.1 mmol/L Final  . Chloride 03/04/2019 102  98 - 111 mmol/L Final  . CO2 03/04/2019 29  22 - 32 mmol/L Final  . Glucose, Bld 03/04/2019 132* 70 - 99 mg/dL Final  . BUN 03/04/2019 27* 8 - 23 mg/dL Final  . Creatinine, Ser 03/04/2019 0.76  0.61 - 1.24 mg/dL Final  . Calcium 03/04/2019 9.3  8.9 - 10.3 mg/dL Final  . Total Protein 03/04/2019 6.6  6.5 - 8.1 g/dL Final  . Albumin 03/04/2019 3.5  3.5 - 5.0 g/dL Final  . AST 03/04/2019 19  15 - 41 U/L Final  . ALT 03/04/2019 24  0 - 44 U/L Final  . Alkaline Phosphatase 03/04/2019 40  38 - 126 U/L Final  . Total Bilirubin 03/04/2019 0.3  0.3 - 1.2 mg/dL Final  . GFR calc non Af Amer 03/04/2019 >60  >60 mL/min Final  . GFR calc Af Amer 03/04/2019 >60  >60 mL/min Final  . Anion gap 03/04/2019 6  5 - 15 Final   Performed at [facility], [address], [place], [place] [address]  . WBC 03/04/2019 7.8  4.0 - 10.5 K/uL Final  . RBC 03/04/2019 4.45  4.22 - 5.81 MIL/uL Final  . Hemoglobin 03/04/2019 13.7  13.0 - 17.0 g/dL Final  . HCT 03/04/2019 41.5  39.0 - 52.0 % Final  . MCV 03/04/2019 93.3  80.0 - 100.0 fL Final  . MCH 03/04/2019 30.8  26.0 - 34.0 pg Final  . MCHC 03/04/2019 33.0  30.0 - 36.0 g/dL Final  . RDW 03/04/2019 13.8  11.5 - 15.5 % Final  . Platelets 03/04/2019 213  150 - 400 K/uL Final  . nRBC 03/04/2019 0.0  0.0 - 0.2 % Final  . Neutrophils Relative % 03/04/2019 79  % Final  . Neutro Abs 03/04/2019 6.2  1.7 - 7.7 K/uL Final  . Lymphocytes Relative 03/04/2019 13  % Final  . Lymphs Abs 03/04/2019 1.0   0.7 - 4.0 K/uL Final  . Monocytes Relative 03/04/2019 5  % Final  . Monocytes Absolute 03/04/2019 0.4  0.1 - 1.0 K/uL Final  . Eosinophils Relative 03/04/2019 2  % Final  . Eosinophils Absolute 03/04/2019 0.1  0.0 - 0.5 K/uL Final  . Basophils Relative 03/04/2019 1  % Final  . Basophils Absolute 03/04/2019 0.0  0.0 - 0.1 K/uL Final  . Immature Granulocytes 03/04/2019 0  % Final  . Abs Immature Granulocytes 03/04/2019 0.03  0.00 - 0.07 K/uL Final   Performed at [facility], [address] [place], [place] [address]  . 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 PLA2R stain was positive. He presented with nephrotic range proteinuria and edema.  He is on prednisone 5 mg a day.  He received 3 cycles of Cytoxan (04/15/2015 - 08/13/2015).  He received 500 mg/m2 (max dose 1 gm IV per plan).  He has tolerated his chemotherapy well.  Per his report, proteinuria has decreased from 16 gm to 1 gm.   Proteinuria dropped to < 500.  He developed recurrent proteinuria.  24 hour urine on 01/13/2019 revealed 8,855 mg protein.   He received  Rituxan on 03/07/2019.  Hepatitis B and C serologies were negative on 03/04/2019.  He has a 60-80 pack year smoking history.  Malignancy work-up on 02/18/2015 revealed a normal PSA (0.7) and an elevated CEA (6.3).  Chest CT on 03/04/2015 revealed no evidence of malignancy.  EGD on 03/19/2015 revealed a short segment of salmon colored mucosa (biopsied).  H pylori was detected.  Colonoscopy on 03/19/2015 revealed 10 colonic polyps (benign).  PET scan on 03/25/2015 revealed no evidence of metastatic disease.  He has a history of H pylori in 03/2015.  He developed a drug rash likely due to  clarithromycin.  Symptomatically, he notes that the edema in his hands has improved.  Weight is up 7 pounds.  Exam is stable.  Plan: 1.   Membranous glomerulonephritis PLA2R  Clinically, patient notes some improvement in edema.  Weight is up, although it does not appear to be increased fluid weight.  Patient has recurrent proteinuria.  Rituxan 1500 mg IV today.  Premedications:  Solumedrol 100 mg IV, Benadryl 50 mg po, and Tylenol 650 mg po.  Patient consented to treatment.  Follow-up as scheduled with Dr. Juleen China. 2.   RTC prn.   Lequita Asal, MD  03/21/2019, 8:57 AM

## 2019-10-27 ENCOUNTER — Other Ambulatory Visit: Payer: Self-pay | Admitting: Internal Medicine

## 2019-10-27 DIAGNOSIS — Z136 Encounter for screening for cardiovascular disorders: Secondary | ICD-10-CM

## 2019-10-27 DIAGNOSIS — Z87891 Personal history of nicotine dependence: Secondary | ICD-10-CM

## 2019-10-27 DIAGNOSIS — I700 Atherosclerosis of aorta: Secondary | ICD-10-CM

## 2020-10-25 ENCOUNTER — Ambulatory Visit
Admission: RE | Admit: 2020-10-25 | Discharge: 2020-10-25 | Disposition: A | Discharge status: Home / Self Care | Payer: Medicare Other | Attending: Nephrology | Admitting: Nephrology

## 2020-10-25 ENCOUNTER — Other Ambulatory Visit: Payer: Self-pay

## 2020-10-25 ENCOUNTER — Other Ambulatory Visit: Payer: Self-pay | Admitting: Nephrology

## 2020-10-25 ENCOUNTER — Ambulatory Visit
Admission: RE | Admit: 2020-10-25 | Discharge: 2020-10-25 | Disposition: A | Discharge status: Home / Self Care | Payer: Medicare Other | Source: Ambulatory Visit | Attending: Nephrology | Admitting: Nephrology

## 2020-10-25 DIAGNOSIS — M25572 Pain in left ankle and joints of left foot: Secondary | ICD-10-CM | POA: Diagnosis not present

## 2020-10-25 NOTE — Progress Notes (Signed)
foor

## 2021-01-18 DEATH — deceased
